# Patient Record
Sex: Female | Born: 1955 | Race: Black or African American | Hispanic: No | Smoking: Current every day smoker
Health system: Southern US, Community
[De-identification: ages and names within clinical notes are randomized; demographics above are authoritative.]

## PROBLEM LIST (undated history)

## (undated) DIAGNOSIS — F329 Major depressive disorder, single episode, unspecified: Secondary | ICD-10-CM

## (undated) DIAGNOSIS — F32A Depression, unspecified: Secondary | ICD-10-CM

## (undated) DIAGNOSIS — F419 Anxiety disorder, unspecified: Secondary | ICD-10-CM

---

## 1996-06-25 HISTORY — PX: ABDOMINAL HYSTERECTOMY: SHX81

## 2000-01-31 ENCOUNTER — Encounter: Admission: RE | Admit: 2000-01-31 | Discharge: 2000-01-31 | Payer: Self-pay | Admitting: Family Medicine

## 2000-01-31 ENCOUNTER — Encounter: Payer: Self-pay | Admitting: Family Medicine

## 2000-02-07 ENCOUNTER — Encounter: Admission: RE | Admit: 2000-02-07 | Discharge: 2000-02-07 | Payer: Self-pay | Admitting: Unknown Physician Specialty

## 2000-02-07 ENCOUNTER — Encounter: Payer: Self-pay | Admitting: Family Medicine

## 2000-11-15 ENCOUNTER — Other Ambulatory Visit: Admission: RE | Admit: 2000-11-15 | Discharge: 2000-11-15 | Payer: Self-pay | Admitting: Family Medicine

## 2004-08-02 ENCOUNTER — Ambulatory Visit: Payer: Self-pay | Admitting: Internal Medicine

## 2004-09-08 ENCOUNTER — Ambulatory Visit: Payer: Self-pay | Admitting: Internal Medicine

## 2004-09-11 ENCOUNTER — Ambulatory Visit: Payer: Self-pay | Admitting: Internal Medicine

## 2004-09-20 ENCOUNTER — Ambulatory Visit (HOSPITAL_COMMUNITY): Admission: RE | Admit: 2004-09-20 | Discharge: 2004-09-20 | Payer: Self-pay

## 2004-10-02 ENCOUNTER — Ambulatory Visit: Payer: Self-pay | Admitting: Internal Medicine

## 2007-02-20 ENCOUNTER — Emergency Department (HOSPITAL_COMMUNITY): Admission: EM | Admit: 2007-02-20 | Discharge: 2007-02-20 | Payer: Self-pay | Admitting: Family Medicine

## 2008-04-15 ENCOUNTER — Ambulatory Visit (HOSPITAL_COMMUNITY): Admission: RE | Admit: 2008-04-15 | Discharge: 2008-04-15 | Payer: Self-pay | Admitting: Family Medicine

## 2008-07-21 ENCOUNTER — Emergency Department (HOSPITAL_COMMUNITY): Admission: EM | Admit: 2008-07-21 | Discharge: 2008-07-21 | Payer: Self-pay | Admitting: Family Medicine

## 2009-01-17 ENCOUNTER — Emergency Department (HOSPITAL_COMMUNITY): Admission: EM | Admit: 2009-01-17 | Discharge: 2009-01-17 | Payer: Self-pay | Admitting: Emergency Medicine

## 2010-06-10 ENCOUNTER — Emergency Department (HOSPITAL_COMMUNITY)
Admission: EM | Admit: 2010-06-10 | Discharge: 2010-06-10 | Payer: Self-pay | Source: Home / Self Care | Admitting: Family Medicine

## 2011-04-11 ENCOUNTER — Other Ambulatory Visit (HOSPITAL_COMMUNITY): Payer: Self-pay | Admitting: Family Medicine

## 2012-07-21 ENCOUNTER — Other Ambulatory Visit (HOSPITAL_COMMUNITY): Payer: Self-pay | Admitting: Family Medicine

## 2012-07-21 DIAGNOSIS — Z1231 Encounter for screening mammogram for malignant neoplasm of breast: Secondary | ICD-10-CM

## 2012-08-15 ENCOUNTER — Other Ambulatory Visit: Payer: Self-pay | Admitting: Obstetrics and Gynecology

## 2012-08-18 ENCOUNTER — Encounter (HOSPITAL_COMMUNITY): Payer: Self-pay | Admitting: *Deleted

## 2012-08-26 ENCOUNTER — Ambulatory Visit (HOSPITAL_COMMUNITY)
Admission: RE | Admit: 2012-08-26 | Discharge: 2012-08-26 | Disposition: A | Discharge status: Home / Self Care | Payer: Self-pay | Source: Ambulatory Visit | Attending: Obstetrics and Gynecology | Admitting: Obstetrics and Gynecology

## 2012-08-26 ENCOUNTER — Encounter (HOSPITAL_COMMUNITY): Payer: Self-pay

## 2012-08-26 VITALS — BP 122/80 | Temp 98.1°F | Ht 67.0 in | Wt 193.6 lb

## 2012-08-26 DIAGNOSIS — Z1239 Encounter for other screening for malignant neoplasm of breast: Secondary | ICD-10-CM

## 2012-08-26 DIAGNOSIS — Z1231 Encounter for screening mammogram for malignant neoplasm of breast: Secondary | ICD-10-CM

## 2012-08-26 NOTE — Patient Instructions (Addendum)
Taught patient how to perform BSE. Patient did not need a Pap smear today due to last Pap smear due to a history of a hysterectomy for benign reasons. Let patient know that she would not need any further Pap smears due to history of a hysterectomy for benign reasons and no history of an abnormal Pap smear.  Let patient know will follow up with her within the next couple weeks with results by letter or phone. Patient verbalized understanding. Patient is escorted to mammography for a screening mammogram.

## 2012-08-26 NOTE — Progress Notes (Signed)
No complaints today.  Pap Smear:    Pap smear not completed today. Last Pap smear was 07/19/2008 and normal. Per patient no history of abnormal Pap smears. Patient has a history of a hysterectomy in 1998 due to fiborids. Per ACOG and BCCCP guidelines patient does not need any further Pap smears.  Pap smear result above is in EPIC.  Physical exam: Breasts Breasts symmetrical. No skin abnormalities bilateral breasts. Small skin lesion observed under left breast.  No nipple retraction bilateral breasts. No nipple discharge bilateral breasts. No lymphadenopathy. No lumps palpated bilateral breasts. No complaints of pain or tenderness on exam. Patient escorted to mammography for a screening mammogram.        Pelvic/Bimanual No Pap smear completed today since patient has a history of a hysterectomy for benign reasons. Pap smear not indicated per BCCCP guidelines.

## 2012-10-23 ENCOUNTER — Emergency Department (HOSPITAL_COMMUNITY)
Admission: EM | Admit: 2012-10-23 | Discharge: 2012-10-23 | Disposition: A | Discharge status: Home / Self Care | Payer: Self-pay | Attending: Emergency Medicine | Admitting: Emergency Medicine

## 2012-10-23 ENCOUNTER — Encounter (HOSPITAL_COMMUNITY): Payer: Self-pay

## 2012-10-23 DIAGNOSIS — F17200 Nicotine dependence, unspecified, uncomplicated: Secondary | ICD-10-CM | POA: Insufficient documentation

## 2012-10-23 DIAGNOSIS — R5381 Other malaise: Secondary | ICD-10-CM | POA: Insufficient documentation

## 2012-10-23 DIAGNOSIS — R42 Dizziness and giddiness: Secondary | ICD-10-CM | POA: Insufficient documentation

## 2012-10-23 DIAGNOSIS — Z79899 Other long term (current) drug therapy: Secondary | ICD-10-CM | POA: Insufficient documentation

## 2012-10-23 DIAGNOSIS — R110 Nausea: Secondary | ICD-10-CM | POA: Insufficient documentation

## 2012-10-23 DIAGNOSIS — R197 Diarrhea, unspecified: Secondary | ICD-10-CM | POA: Insufficient documentation

## 2012-10-23 DIAGNOSIS — R5383 Other fatigue: Secondary | ICD-10-CM | POA: Insufficient documentation

## 2012-10-23 MED ORDER — MECLIZINE HCL 25 MG PO TABS
50.0000 mg | ORAL_TABLET | Freq: Once | ORAL | Status: AC
  Administered 2012-10-23: 50 mg via ORAL
  Filled 2012-10-23: qty 2

## 2012-10-23 MED ORDER — MECLIZINE HCL 50 MG PO TABS: 25.0000 mg | ORAL_TABLET | Freq: Three times a day (TID) | ORAL | Status: DC | PRN

## 2012-10-23 NOTE — ED Notes (Signed)
Pt presents with dizziness and nausea that started this morning. States she had a recent cold. Denies any weakness.

## 2012-10-23 NOTE — ED Notes (Signed)
Dr. Noe Gens at the bedside with [place], [place]

## 2012-10-23 NOTE — ED Notes (Signed)
Shari, PA at the bedside.  

## 2012-10-23 NOTE — ED Provider Notes (Signed)
History     CSN: [id number]  Arrival date & time 10/23/12  4098   First MD Initiated Contact with Patient 10/23/12 [phone]      Chief Complaint  Patient presents with  . Dizziness    (Consider location/radiation/quality/duration/timing/severity/associated sxs/prior treatment) Patient is a 57 y.o. female presenting with weakness. The history is provided by the patient. No language interpreter was used.  Weakness This is a new problem. The current episode started today. The problem has been unchanged. Associated symptoms include nausea and weakness. Pertinent negatives include no abdominal pain, chest pain, diaphoresis, fever, headaches, myalgias or visual change. Associated symptoms comments: Dizziness since shortly after getting up this morning like the room is spinning. Some diarrhea, mild nausea without vomiting. No fever. She reports having URI symptoms about 2 weeks ago but no other recent illnesses. She feels she is off balance to the right when walking but denies falls or any weakness in extremities..    History reviewed. No pertinent past medical history.  Past Surgical History  Procedure Laterality Date  . Abdominal hysterectomy  1998    Family History  Problem Relation Age of Onset  . Diabetes Father   . Cancer Father     prostate, colon    History  Substance Use Topics  . Smoking status: Current Every Day Smoker -- 0.25 packs/day for 20 years    Types: Cigarettes  . Smokeless tobacco: Never Used  . Alcohol Use: Yes     Comment: occassionally    OB History   Grav Para Term Preterm Abortions TAB SAB Ect Mult Living   4 2 2  2  2   2       Review of Systems  Constitutional: Negative for fever and diaphoresis.  HENT: Negative for hearing loss and tinnitus.   Eyes: Negative for visual disturbance.  Respiratory: Negative for shortness of breath.   Cardiovascular: Negative for chest pain.  Gastrointestinal: Positive for nausea and diarrhea. Negative for abdominal  pain.  Musculoskeletal: Negative for myalgias.  Neurological: Positive for dizziness and weakness. Negative for headaches.  Psychiatric/Behavioral: Negative for confusion.    Allergies  Review of patient's allergies indicates no known allergies.  Home Medications   Current Outpatient Rx  Name  Route  Sig  Dispense  Refill  . citalopram (CELEXA) 10 MG tablet   Oral   Take 10 mg by mouth daily.           BP 164/97  Pulse 92  Temp(Src) 98 F (36.7 C) (Oral)  Resp 14  SpO2 99%  Physical Exam  Constitutional: She is oriented to person, place, and time. She appears well-developed and well-nourished.  HENT:  Head: Normocephalic.  Eyes: Pupils are equal, round, and reactive to light.  Neck: Normal range of motion. Neck supple.  Cardiovascular: Normal rate and regular rhythm.   Pulmonary/Chest: Effort normal and breath sounds normal.  Abdominal: Soft. Bowel sounds are normal. There is no tenderness. There is no rebound and no guarding.  Musculoskeletal: Normal range of motion.  Neurological: She is alert and oriented to person, place, and time. She has normal strength and normal reflexes. No sensory deficit. She displays a negative Romberg sign. Coordination normal.  No pronator drift, normal coordination without deficit. Cranial nerves 3-12 grossly intact. Patient does not cooperate with eye ROM to determine nystagmus.  Skin: Skin is warm and dry. No rash noted.  Psychiatric: She has a normal mood and affect.    ED Course  Procedures (including critical  care time)  Labs Reviewed - No data to display No results found.   No diagnosis found.  1. Vertigo   MDM  Meclizine with improvement. Symptoms and exam support benign positional vertigo.         Arnoldo Hooker, PA-C 10/23/12 1108

## 2012-10-24 NOTE — ED Provider Notes (Signed)
Medical screening examination/treatment/procedure(s) were performed by non-physician practitioner and as supervising physician I was immediately available for consultation/collaboration.    Vida Roller, MD 10/24/12 [phone]

## 2013-02-26 ENCOUNTER — Ambulatory Visit: Payer: Self-pay

## 2013-08-05 ENCOUNTER — Other Ambulatory Visit (HOSPITAL_COMMUNITY): Payer: Self-pay | Admitting: Physician Assistant

## 2013-08-05 DIAGNOSIS — Z1231 Encounter for screening mammogram for malignant neoplasm of breast: Secondary | ICD-10-CM

## 2013-08-31 ENCOUNTER — Ambulatory Visit (HOSPITAL_COMMUNITY): Payer: Self-pay

## 2013-09-11 ENCOUNTER — Ambulatory Visit (HOSPITAL_COMMUNITY): Payer: Self-pay

## 2013-09-28 ENCOUNTER — Ambulatory Visit (HOSPITAL_COMMUNITY)
Admission: RE | Admit: 2013-09-28 | Discharge: 2013-09-28 | Disposition: A | Discharge status: Home / Self Care | Payer: Self-pay | Source: Ambulatory Visit | Attending: Physician Assistant | Admitting: Physician Assistant

## 2013-09-28 DIAGNOSIS — Z1231 Encounter for screening mammogram for malignant neoplasm of breast: Secondary | ICD-10-CM

## 2014-04-26 ENCOUNTER — Encounter (HOSPITAL_COMMUNITY): Payer: Self-pay

## 2014-05-12 ENCOUNTER — Encounter (HOSPITAL_COMMUNITY): Payer: Self-pay

## 2014-05-12 ENCOUNTER — Emergency Department (HOSPITAL_COMMUNITY): Payer: Self-pay

## 2014-05-12 ENCOUNTER — Observation Stay (HOSPITAL_COMMUNITY)
Admission: EM | Admit: 2014-05-12 | Discharge: 2014-05-13 | Disposition: A | Discharge status: Home / Self Care | Payer: Self-pay | Attending: Internal Medicine | Admitting: Internal Medicine

## 2014-05-12 DIAGNOSIS — F17210 Nicotine dependence, cigarettes, uncomplicated: Secondary | ICD-10-CM | POA: Insufficient documentation

## 2014-05-12 DIAGNOSIS — Z720 Tobacco use: Secondary | ICD-10-CM | POA: Diagnosis present

## 2014-05-12 DIAGNOSIS — F329 Major depressive disorder, single episode, unspecified: Secondary | ICD-10-CM | POA: Insufficient documentation

## 2014-05-12 DIAGNOSIS — Z79899 Other long term (current) drug therapy: Secondary | ICD-10-CM | POA: Insufficient documentation

## 2014-05-12 DIAGNOSIS — F32A Depression, unspecified: Secondary | ICD-10-CM | POA: Diagnosis present

## 2014-05-12 DIAGNOSIS — R079 Chest pain, unspecified: Principal | ICD-10-CM | POA: Insufficient documentation

## 2014-05-12 DIAGNOSIS — F419 Anxiety disorder, unspecified: Secondary | ICD-10-CM | POA: Diagnosis present

## 2014-05-12 HISTORY — DX: Anxiety disorder, unspecified: F41.9

## 2014-05-12 HISTORY — DX: Major depressive disorder, single episode, unspecified: F32.9

## 2014-05-12 HISTORY — DX: Depression, unspecified: F32.A

## 2014-05-12 LAB — CBC
HCT: 37.7 % (ref 36.0–46.0)
HEMOGLOBIN: 12.6 g/dL (ref 12.0–15.0)
MCH: 26.5 pg (ref 26.0–34.0)
MCHC: 33.4 g/dL (ref 30.0–36.0)
MCV: 79.2 fL (ref 78.0–100.0)
Platelets: 235 10*3/uL (ref 150–400)
RBC: 4.76 MIL/uL (ref 3.87–5.11)
RDW: 14.3 % (ref 11.5–15.5)
WBC: 6.4 10*3/uL (ref 4.0–10.5)

## 2014-05-12 LAB — BASIC METABOLIC PANEL
Anion gap: 12 (ref 5–15)
BUN: 17 mg/dL (ref 6–23)
CALCIUM: 10 mg/dL (ref 8.4–10.5)
CO2: 27 mEq/L (ref 19–32)
Chloride: 103 mEq/L (ref 96–112)
Creatinine, Ser: 0.78 mg/dL (ref 0.50–1.10)
GLUCOSE: 88 mg/dL (ref 70–99)
POTASSIUM: 4.4 meq/L (ref 3.7–5.3)
Sodium: 142 mEq/L (ref 137–147)

## 2014-05-12 LAB — I-STAT TROPONIN, ED: Troponin i, poc: 0 ng/mL (ref 0.00–0.08)

## 2014-05-12 MED ORDER — ASPIRIN 81 MG PO CHEW
324.0000 mg | CHEWABLE_TABLET | Freq: Once | ORAL | Status: AC
  Administered 2014-05-12: 324 mg via ORAL
  Filled 2014-05-12: qty 4

## 2014-05-12 NOTE — H&P (Signed)
[facility] History and Physical  Lauren DiegoVicki R Therrell ZOX:[id number] DOB: [birth year] DOA: 05/12/2014  Referring physician: ED physician PCP: Default, Provider, MD  Specialists:   Chief Complaint: chest pain  HPI: Lauren Rogers is a 58 y.o. female without a significant past medical history except for smoking, who presents with chest pain.  Patient states pain has been intermittent over the past 3 weeks. She reports that her chest pain appears to be related to exertional activities. her chest pain is located in the substernal area, pressure-like, 5-10 out of 10 in severity, radiating to the back, it is aggravated by picking up kids (patient is taking care of kids at home). Her chest pain is intermittent, happens approximately once a day. Each time, it lasts for about 1 hour her chest pain is not pleuritic. It is not aggravated by deep breath.   Patient does not have fever, chills, shortness of breath, nausea, vomiting, diarrhea, abdominal pain, hematuria, hematochezia, leg edema.   Work up in the ED demonstrates Negative chest x-ray. Initial troponin and EKG is negative.  Review of Systems: As presented in the history of presenting illness, rest negative.  Where does patient live?  At home Can patient participate in ADLs? Yes  Allergy: No Known Allergies  History reviewed. No pertinent past medical history.  Past Surgical History  Procedure Laterality Date  . Abdominal hysterectomy  1998    Social History:  reports that she has been smoking Cigarettes.  She has a 5 pack-year smoking history. She has never used smokeless tobacco. She reports that she drinks alcohol. She reports that she does not use illicit drugs.  Family History:  Family History  Problem Relation Age of Onset  . Diabetes Father   . Cancer Father     prostate, colon     Prior to Admission medications   Medication Sig Start Date End Date Taking? Authorizing Provider  buPROPion (WELLBUTRIN XL) 300 MG 24 hr  tablet Take 300 mg by mouth daily.   Yes Historical Provider, MD  clonazePAM (KLONOPIN) 2 MG tablet Take 2 mg by mouth daily as needed for anxiety.   Yes Historical Provider, MD  hydrOXYzine (ATARAX/VISTARIL) 10 MG tablet Take 10 mg by mouth 3 (three) times daily as needed. 04/23/14  Yes Historical Provider, MD  SERTRALINE HCL PO Take 1 tablet by mouth at bedtime.   Yes Historical Provider, MD  triazolam (HALCION) 0.25 MG tablet Take 0.25 mg by mouth at bedtime as needed (for sleep).   Yes Historical Provider, MD  meclizine (ANTIVERT) 50 MG tablet Take 0.5 tablets (25 mg total) by mouth 3 (three) times daily as needed. Patient not taking: Reported on 05/12/2014 10/23/12   Arnoldo HookerShari A Upstill, PA-C    Physical Exam: Filed Vitals:   05/12/14 1829 05/12/14 2115 05/12/14 2145 05/12/14 2208  BP: 138/93 138/87 147/88   Pulse:  61    Temp: 97.8 F (36.6 C)     TempSrc: Oral     Resp: 18 15 11    Height:    5\' 7"  (1.702 m)  Weight:    84.369 kg (186 lb)  SpO2: 99% 100%     General: Not in acute distress HEENT:       Eyes: PERRL, EOMI, no scleral icterus       ENT: No discharge from the ears and nose, no pharynx injection, no tonsillar enlargement.        Neck: No JVD, no bruit, no mass felt. Cardiac: S1/S2, RRR, No murmurs,  No gallops or rubs Pulm: Good air movement bilaterally. Clear to auscultation bilaterally. No rales, wheezing, rhonchi or rubs. Abd: Soft, nondistended, nontender, no rebound pain, no organomegaly, BS present Ext: No edema bilaterally. 2+DP/PT pulse bilaterally Musculoskeletal: No joint deformities, erythema, or stiffness, ROM full Skin: No rashes.  Neuro: Alert and oriented X3, cranial nerves II-XII grossly intact, muscle strength 5/5 in all extremeties, sensation to light touch intact. Brachial reflex 2+ bilaterally. Knee reflex 1+ bilaterally. Negative Babinski's sign. Normal finger to nose test. Psych: Patient is not psychotic, no suicidal or hemocidal ideation.  Labs on  Admission:  Basic Metabolic Panel:  Recent Labs Lab 05/12/14 1842  NA 142  K 4.4  CL 103  CO2 27  GLUCOSE 88  BUN 17  CREATININE 0.78  CALCIUM 10.0   Liver Function Tests: No results for input(s): AST, ALT, ALKPHOS, BILITOT, PROT, ALBUMIN in the last 168 hours. No results for input(s): LIPASE, AMYLASE in the last 168 hours. No results for input(s): AMMONIA in the last 168 hours. CBC:  Recent Labs Lab 05/12/14 1842  WBC 6.4  HGB 12.6  HCT 37.7  MCV 79.2  PLT 235   Cardiac Enzymes: No results for input(s): CKTOTAL, CKMB, CKMBINDEX, TROPONINI in the last 168 hours.  BNP (last 3 results) No results for input(s): PROBNP in the last 8760 hours. CBG: No results for input(s): GLUCAP in the last 168 hours.  Radiological Exams on Admission: Dg Chest 2 View  05/12/2014   CLINICAL DATA:  Midsternal chest pain for 2 weeks  EXAM: CHEST  2 VIEW  COMPARISON:  None.  FINDINGS: The heart size and mediastinal contours are within normal limits. Both lungs are clear. The visualized skeletal structures are unremarkable.  IMPRESSION: No active cardiopulmonary disease.   Electronically Signed   By: Alcide CleverMark  Lukens M.D.   On: 05/12/2014 19:57    EKG: Independently reviewed.   Assessment/Plan Principal Problem:   Chest pain Active Problems:   Tobacco abuse  Chest pain: her chest pain seems to be exertional. It is important to rule out ACS. Chest x-ray is negative for pneumonia. Pulmonary embolism is less likely given low Well's score.  - will admit to tele bed - cycle CE q6 x3 and repeat her EKG in the am  - Nitroglycerin, Morphine, and aspirin - Risk factor stratification: will check TSH, FLP and A1C  - Consider cardiology consult if test positive for CEs  - 2 D echo - UDS  Tobacco abuse: Patient smokes 2-3 cigarettes per day for more than 20 years. - did counseling   DVT ppx: SQ Heparin    Code Status: Full code Family Communication: Yes, patient's daughter   at bed  side Disposition Plan: Admit to inpatient   Date of Service 05/12/2014    Lorretta HarpIU, Brooklen Runquist [facility] Pager [phone]  If 7PM-7AM, please contact night-coverage [email or web address] Password [email or web address] 05/12/2014, 10:55 PM

## 2014-05-12 NOTE — ED Notes (Signed)
Pt from home with chest pain that has been present since this am.  Pt went to urgent care and was told to follow up at ER if pain persisted.  Pt localizes pain to central chest with radiation to back.  Pain 10/10. Reports nausea and feeling hot; denies diaphoresis, weakness, vomiting or shortness of breath.

## 2014-05-12 NOTE — ED Provider Notes (Signed)
CSN: [id number]     Arrival date & time 05/12/14  1820 History   First MD Initiated Contact with Patient 05/12/14 2120     Chief Complaint  Patient presents with  . Chest Pain     (Consider location/radiation/quality/duration/timing/severity/associated sxs/prior Treatment) Patient is a 58 y.o. female presenting with chest pain. The history is provided by the patient and medical records.  Chest Pain Associated symptoms: diaphoresis and shortness of breath     This is a 58 year old female with no significant past medical history presenting to the ED for chest pain. Patient states pain has been intermittent over the past 3 weeks. States pain appears to be related to exertional activities.  States when pain occurs it is localized to her mid-sternal region with radiation to her back.  States duration varies, can last as long as 1 hour before resolving.  She notes associated SOB, diaphoresis, nausea when pain occurs.  Patient denies prior cardiac hx.  No known family cardiac hx.  She is a daily smoker, approx 3-4 cigs/day.  Patient states she was having pain on arrival, but resolved while in the waiting room. PCP-- Holley Boucheandall Harris [facility]  History reviewed. No pertinent past medical history. Past Surgical History  Procedure Laterality Date  . Abdominal hysterectomy  1998   Family History  Problem Relation Age of Onset  . Diabetes Father   . Cancer Father     prostate, colon   History  Substance Use Topics  . Smoking status: Current Every Day Smoker -- 0.25 packs/day for 20 years    Types: Cigarettes  . Smokeless tobacco: Never Used  . Alcohol Use: Yes     Comment: occassionally   OB History    Gravida Para Term Preterm AB TAB SAB Ectopic Multiple Living   4 2 2  2  2   2      Review of Systems  Constitutional: Positive for diaphoresis.  Respiratory: Positive for shortness of breath.   Cardiovascular: Positive for chest pain.  All other systems reviewed and are  negative.     Allergies  Review of patient's allergies indicates no known allergies.  Home Medications   Prior to Admission medications   Medication Sig Start Date End Date Taking? Authorizing Provider  meclizine (ANTIVERT) 50 MG tablet Take 0.5 tablets (25 mg total) by mouth 3 (three) times daily as needed. 10/23/12   Shari A Upstill, PA-C  triazolam (HALCION) 0.25 MG tablet Take 0.25 mg by mouth at bedtime as needed (for sleep).    Historical Provider, MD   BP 138/93 mmHg  Temp(Src) 97.8 F (36.6 C) (Oral)  Resp 18  SpO2 99%   Physical Exam  Constitutional: She is oriented to person, place, and time. She appears well-developed and well-nourished. No distress.  HENT:  Head: Normocephalic and atraumatic.  Mouth/Throat: Oropharynx is clear and moist.  Eyes: Conjunctivae and EOM are normal. Pupils are equal, round, and reactive to light.  Neck: Normal range of motion. Neck supple.  Cardiovascular: Normal rate, regular rhythm and normal heart sounds.   Pulmonary/Chest: Effort normal and breath sounds normal. No respiratory distress. She has no wheezes.  No chest wall tenderness or deformities noted  Abdominal: Soft. Bowel sounds are normal. There is no tenderness. There is no guarding.  Musculoskeletal: Normal range of motion.  Neurological: She is alert and oriented to person, place, and time.  Skin: Skin is warm and dry. She is not diaphoretic.  Psychiatric: She has a normal mood and affect.  Nursing note and vitals reviewed.   ED Course  Procedures (including critical care time) Labs Review Labs Reviewed  CBC  BASIC METABOLIC PANEL  I-STAT TROPOININ, ED    Imaging Review Dg Chest 2 View  05/12/2014   CLINICAL DATA:  Midsternal chest pain for 2 weeks  EXAM: CHEST  2 VIEW  COMPARISON:  None.  FINDINGS: The heart size and mediastinal contours are within normal limits. Both lungs are clear. The visualized skeletal structures are unremarkable.  IMPRESSION: No active  cardiopulmonary disease.   Electronically Signed   By: Alcide CleverMark  Lukens M.D.   On: 05/12/2014 19:57     EKG Interpretation   Date/Time:  Wednesday May 12 2014 18:24:10 EST Ventricular Rate:  71 PR Interval:  146 QRS Duration: 64 QT Interval:  376 QTC Calculation: 408 R Axis:   67 Text Interpretation:  Normal sinus rhythm Normal ECG Confirmed by  Rubin PayorPICKERING  MD, Harrold DonathNATHAN [phone] on 05/12/2014 9:30:35 PM      MDM   Final diagnoses:  Chest pain   58 y.o. F with intermittent chest pain over the past 3 weeks, appears to be related with exertion.  Notes associated SOB and diaphoresis with pain. Work-up initiated in triage-- EKG sinus rhythm without ischemic changes.  Trop negative.  Lab work reassuring.  CXR clear.  Patient currently pain free.  Given her intermittent exertional chest pain and associated symptoms as well as HEART score of 5, feel she would benefit from cardiac rule out.  Case discussed with hospitalist, Dr. Clyde LundborgNiu, who will admit for telemetry observation.  Temp admission orders placed, VS remain stable.  Garlon HatchetLisa M Emillia Weatherly, PA-C 05/12/14 2314  Juliet RudeNathan R. Rubin PayorPickering, MD 05/12/14 [phone]

## 2014-05-13 ENCOUNTER — Encounter (HOSPITAL_COMMUNITY): Payer: Self-pay | Admitting: General Practice

## 2014-05-13 DIAGNOSIS — Z720 Tobacco use: Secondary | ICD-10-CM

## 2014-05-13 DIAGNOSIS — F329 Major depressive disorder, single episode, unspecified: Secondary | ICD-10-CM

## 2014-05-13 DIAGNOSIS — F419 Anxiety disorder, unspecified: Secondary | ICD-10-CM

## 2014-05-13 DIAGNOSIS — R0789 Other chest pain: Secondary | ICD-10-CM

## 2014-05-13 LAB — LIPID PANEL
Cholesterol: 166 mg/dL (ref 0–200)
HDL: 58 mg/dL (ref >39)
LDL CALC: 89 mg/dL (ref 0–99)
Total CHOL/HDL Ratio: 2.9 RATIO
Triglycerides: 93 mg/dL (ref <150)
VLDL: 19 mg/dL (ref 0–40)

## 2014-05-13 LAB — HEMOGLOBIN A1C
Hgb A1c MFr Bld: 5.9 % — ABNORMAL HIGH (ref <5.7)
Mean Plasma Glucose: 123 mg/dL — ABNORMAL HIGH (ref <117)

## 2014-05-13 LAB — COMPREHENSIVE METABOLIC PANEL
ALBUMIN: 3.9 g/dL (ref 3.5–5.2)
ALK PHOS: 90 U/L (ref 39–117)
ALT: 11 U/L (ref 0–35)
ANION GAP: 14 (ref 5–15)
AST: 15 U/L (ref 0–37)
BUN: 15 mg/dL (ref 6–23)
CO2: 26 meq/L (ref 19–32)
CREATININE: 0.79 mg/dL (ref 0.50–1.10)
Calcium: 9.3 mg/dL (ref 8.4–10.5)
Chloride: 104 mEq/L (ref 96–112)
GFR calc Af Amer: >90 mL/min (ref >90)
Glucose, Bld: 122 mg/dL — ABNORMAL HIGH (ref 70–99)
POTASSIUM: 4 meq/L (ref 3.7–5.3)
Sodium: 144 mEq/L (ref 137–147)
Total Bilirubin: 0.3 mg/dL (ref 0.3–1.2)
Total Protein: 7.4 g/dL (ref 6.0–8.3)

## 2014-05-13 LAB — CBC
HEMATOCRIT: 39.1 % (ref 36.0–46.0)
Hemoglobin: 12.8 g/dL (ref 12.0–15.0)
MCH: 26.5 pg (ref 26.0–34.0)
MCHC: 32.7 g/dL (ref 30.0–36.0)
MCV: 81 fL (ref 78.0–100.0)
Platelets: 249 10*3/uL (ref 150–400)
RBC: 4.83 MIL/uL (ref 3.87–5.11)
RDW: 14.3 % (ref 11.5–15.5)
WBC: 4.7 10*3/uL (ref 4.0–10.5)

## 2014-05-13 LAB — PROTIME-INR
INR: 1.08 (ref 0.00–1.49)
Prothrombin Time: 14.1 seconds (ref 11.6–15.2)

## 2014-05-13 LAB — TROPONIN I

## 2014-05-13 LAB — TSH: TSH: 3.05 u[IU]/mL (ref 0.350–4.500)

## 2014-05-13 MED ORDER — ASPIRIN 81 MG PO CHEW
81.0000 mg | CHEWABLE_TABLET | Freq: Every day | ORAL | Status: DC
  Administered 2014-05-13: 81 mg via ORAL
  Filled 2014-05-13: qty 1

## 2014-05-13 MED ORDER — MECLIZINE HCL 25 MG PO TABS
25.0000 mg | ORAL_TABLET | Freq: Three times a day (TID) | ORAL | Status: DC | PRN
  Filled 2014-05-13: qty 1

## 2014-05-13 MED ORDER — MORPHINE SULFATE 2 MG/ML IJ SOLN: 2.0000 mg | INTRAMUSCULAR | Status: DC | PRN

## 2014-05-13 MED ORDER — OMEPRAZOLE 40 MG PO CPDR: 40.0000 mg | DELAYED_RELEASE_CAPSULE | Freq: Every day | ORAL | Status: AC

## 2014-05-13 MED ORDER — ACETAMINOPHEN 325 MG PO TABS: 650.0000 mg | ORAL_TABLET | Freq: Four times a day (QID) | ORAL | Status: DC | PRN

## 2014-05-13 MED ORDER — PANTOPRAZOLE SODIUM 40 MG PO TBEC
40.0000 mg | DELAYED_RELEASE_TABLET | Freq: Every day | ORAL | Status: DC
  Administered 2014-05-13: 40 mg via ORAL
  Filled 2014-05-13: qty 1

## 2014-05-13 MED ORDER — HYDROXYZINE HCL 25 MG PO TABS
25.0000 mg | ORAL_TABLET | Freq: Three times a day (TID) | ORAL | Status: DC | PRN
  Administered 2014-05-13: 25 mg via ORAL
  Filled 2014-05-13 (×2): qty 1

## 2014-05-13 MED ORDER — TRIAZOLAM 0.25 MG PO TABS
0.2500 mg | ORAL_TABLET | Freq: Every evening | ORAL | Status: DC | PRN
  Administered 2014-05-13: 0.25 mg via ORAL
  Filled 2014-05-13: qty 2
  Filled 2014-05-13: qty 1

## 2014-05-13 MED ORDER — FAMOTIDINE 20 MG PO TABS: 20.0000 mg | ORAL_TABLET | Freq: Two times a day (BID) | ORAL | Status: DC | PRN

## 2014-05-13 MED ORDER — NITROGLYCERIN 0.4 MG SL SUBL: 0.4000 mg | SUBLINGUAL_TABLET | SUBLINGUAL | Status: DC | PRN

## 2014-05-13 MED ORDER — SODIUM CHLORIDE 0.9 % IJ SOLN
3.0000 mL | Freq: Two times a day (BID) | INTRAMUSCULAR | Status: DC
  Administered 2014-05-13 (×2): 3 mL via INTRAVENOUS

## 2014-05-13 MED ORDER — HEPARIN SODIUM (PORCINE) 5000 UNIT/ML IJ SOLN
5000.0000 [IU] | Freq: Three times a day (TID) | INTRAMUSCULAR | Status: DC
  Filled 2014-05-13 (×3): qty 1

## 2014-05-13 MED ORDER — CLONAZEPAM 0.5 MG PO TABS: 2.0000 mg | ORAL_TABLET | Freq: Every day | ORAL | Status: DC | PRN

## 2014-05-13 MED ORDER — BUPROPION HCL ER (XL) 300 MG PO TB24
300.0000 mg | ORAL_TABLET | Freq: Every day | ORAL | Status: DC
  Administered 2014-05-13: 300 mg via ORAL
  Filled 2014-05-13: qty 1

## 2014-05-13 MED ORDER — ACETAMINOPHEN 650 MG RE SUPP: 650.0000 mg | Freq: Four times a day (QID) | RECTAL | Status: DC | PRN

## 2014-05-13 MED ORDER — FAMOTIDINE 20 MG PO TABS
20.0000 mg | ORAL_TABLET | Freq: Every day | ORAL | Status: DC
  Administered 2014-05-13: 20 mg via ORAL
  Filled 2014-05-13: qty 1

## 2014-05-13 MED ORDER — SERTRALINE HCL 25 MG PO TABS
25.0000 mg | ORAL_TABLET | Freq: Every day | ORAL | Status: DC
Start: 2014-05-13 — End: 2014-05-13
  Administered 2014-05-13: 25 mg via ORAL
  Filled 2014-05-13 (×2): qty 1

## 2014-05-13 NOTE — Plan of Care (Signed)
Problem: Phase I Progression Outcomes Goal: Aspirin unless contraindicated Outcome: Completed/Met Date Met:  05/13/14

## 2014-05-13 NOTE — Progress Notes (Signed)
UR completed 

## 2014-05-13 NOTE — Discharge Summary (Signed)
Physician Discharge Summary  Lauren DiegoVicki R Rogers XBJ:[id number] DOB: June 21, 1956 DOA: 05/12/2014  PCP: Johny BlamerHARRIS, WILLIAM, MD  Admit date: 05/12/2014 Discharge date: 05/13/2014  Time spent: 45 minutes  Recommendations for Outpatient Follow-up:  1. Follow-up on chest pain - decide whether the patient needs an outpatient GI eval  Discharge Condition: Stable Diet recommendation: Heart healthy  Discharge Diagnoses:  Principal Problem:   Chest pain-most likely related to gastroesophageal reflux disease/esophagitis Active Problems:   Tobacco abuse   Depression   Anxiety   History of present illness/ Hospital course: Is a 58 year old female with a past medical history of smoking and anxiety who presents with a complaint of chest pain which has been intermittent over the past 3 weeks. She states that it is pressure-like in the center of her chest and sometimes goes into her back. She tells me that it usually occurs after eating and sometimes occurs with certain movements. It appears that yesterday she stated that it is related to exertional activities however today she tells me that it is not related to exertion. The patient was admitted overnight to rule out acute coronary syndrome. Troponins have remained negative- chest pain resolved on its own. EKG on admission revealed sinus rhythm with no significant abnormalities.  The chest pain recurred this morning after she had breakfast currently feels like she has a dull ache in the center of her chest. At this point there is no radiation of pain. She states that sometimes it feels like the food is stuck in her chest or has trouble going down into her stomach however, she does not have any exacerbation of pain upon swallowing. She does not complain of any "heartburn". At times she states the pain occurs when she feels anxious.   Her symptoms are highly suggestive of gastroesophageal reflux disease/esophagitis and also possibly acomponent of panic/anxiety. I  have discussed giving her a trial of proton pump inhibitors with PRN H2 blockers. He has tried Tums but they do not completely relieve her pain. She has no insurance and therefore we have decided that she will take over-the-counter Prilosec daily and Pepcid as needed. I have asked her to make an appointment with her PCP 2 weeks from now and further discuss her symptoms and management. If symptoms have not resolved, she may need a referral for an endoscopy.  Discharge Exam: Filed Weights   05/12/14 2208 05/13/14 0021  Weight: 84.369 kg (186 lb) 83.9 kg (184 lb 15.5 oz)   Filed Vitals:   05/13/14 0458  BP: 108/69  Pulse: 72  Temp: 97.6 F (36.4 C)  Resp: 18    General: AAO x 3, no distress Cardiovascular: RRR, no murmurs  Respiratory: clear to auscultation bilaterally GI: soft, non-tender, non-distended, bowel sound positive  Discharge Instructions You were cared for by a hospitalist during your hospital stay. If you have any questions about your discharge medications or the care you received while you were in the hospital after you are discharged, you can call the unit and asked to speak with the hospitalist on call if the hospitalist that took care of you is not available. Once you are discharged, your primary care physician will handle any further medical issues. Please note that NO REFILLS for any discharge medications will be authorized once you are discharged, as it is imperative that you return to your primary care physician (or establish a relationship with a primary care physician if you do not have one) for your aftercare needs so that they can reassess your  need for medications and monitor your lab values.      Discharge Instructions    Diet - low sodium heart healthy    Complete by:  As directed      Discharge instructions    Complete by:  As directed   Do not take ibuprofen or naproxen as it can worsen the chest pain.     Increase activity slowly    Complete by:  As  directed             Medication List    TAKE these medications        buPROPion 300 MG 24 hr tablet  Commonly known as:  WELLBUTRIN XL  Take 300 mg by mouth daily.     clonazePAM 2 MG tablet  Commonly known as:  KLONOPIN  Take 2 mg by mouth daily as needed for anxiety.     famotidine 20 MG tablet  Commonly known as:  PEPCID  Take 1 tablet (20 mg total) by mouth 2 (two) times daily as needed for heartburn or indigestion.     hydrOXYzine 10 MG tablet  Commonly known as:  ATARAX/VISTARIL  Take 10 mg by mouth 3 (three) times daily as needed.     meclizine 50 MG tablet  Commonly known as:  ANTIVERT  Take 0.5 tablets (25 mg total) by mouth 3 (three) times daily as needed.     omeprazole 40 MG capsule  Commonly known as:  PRILOSEC  Take 1 capsule (40 mg total) by mouth daily.     SERTRALINE HCL PO  Take 1 tablet by mouth at bedtime.     triazolam 0.25 MG tablet  Commonly known as:  HALCION  Take 0.25 mg by mouth at bedtime as needed (for sleep).       No Known Allergies Follow-up Information    Follow up with Johny BlamerHARRIS, WILLIAM, MD In 2 weeks.   Specialty:  Family Medicine   Contact information:   [address] [place] [phone]        The results of significant diagnostics from this hospitalization (including imaging, microbiology, ancillary and laboratory) are listed below for reference.    Significant Diagnostic Studies: Dg Chest 2 View  05/12/2014   CLINICAL DATA:  Midsternal chest pain for 2 weeks  EXAM: CHEST  2 VIEW  COMPARISON:  None.  FINDINGS: The heart size and mediastinal contours are within normal limits. Both lungs are clear. The visualized skeletal structures are unremarkable.  IMPRESSION: No active cardiopulmonary disease.   Electronically Signed   By: Alcide CleverMark  Lukens M.D.   On: 05/12/2014 19:57    Microbiology: No results found for this or any previous visit (from the past 240 hour(s)).   Labs: Basic Metabolic  Panel:  Recent Labs Lab 05/12/14 1842 05/13/14 0850  NA 142 144  K 4.4 4.0  CL 103 104  CO2 27 26  GLUCOSE 88 122*  BUN 17 15  CREATININE 0.78 0.79  CALCIUM 10.0 9.3   Liver Function Tests:  Recent Labs Lab 05/13/14 0850  AST 15  ALT 11  ALKPHOS 90  BILITOT 0.3  PROT 7.4  ALBUMIN 3.9   No results for input(s): LIPASE, AMYLASE in the last 168 hours. No results for input(s): AMMONIA in the last 168 hours. CBC:  Recent Labs Lab 05/12/14 1842 05/13/14 0850  WBC 6.4 4.7  HGB 12.6 12.8  HCT 37.7 39.1  MCV 79.2 81.0  PLT 235 249   Cardiac  Enzymes:  Recent Labs Lab 05/13/14 0148 05/13/14 0850  TROPONINI <0.30 <0.30   BNP: BNP (last 3 results) No results for input(s): PROBNP in the last 8760 hours. CBG: No results for input(s): GLUCAP in the last 168 hours.     SignedCalvert Cantor, MD [facility] 05/13/2014, 11:48 AM

## 2014-05-13 NOTE — Plan of Care (Signed)
Problem: Phase I Progression Outcomes Goal: Other Phase I Outcomes/Goals Outcome: Completed/Met Date Met:  05/13/14     

## 2014-05-13 NOTE — Plan of Care (Signed)
Problem: Phase I Progression Outcomes Goal: Anginal pain relieved Outcome: Completed/Met Date Met:  05/13/14     

## 2014-05-13 NOTE — Plan of Care (Signed)
Problem: Phase I Progression Outcomes Goal: Voiding-avoid urinary catheter unless indicated Outcome: Completed/Met Date Met:  05/13/14     

## 2014-05-13 NOTE — Plan of Care (Signed)
Problem: Phase II Progression Outcomes Goal: Hemodynamically stable Outcome: Progressing [facility] of chest pain radiating to back.  She arrived via stretcher, in no apparent distress.  Alert and oriented x4, ambulatory with steady gait.  Placed on telemetry and oriented to room and floor procedures.  No complaints of chest pain since arrival.  Will continue to monitor patient's condition.

## 2014-05-13 NOTE — Plan of Care (Signed)
Problem: Phase I Progression Outcomes Goal: MD aware of Cardiac Marker results Outcome: Completed/Met Date Met:  05/13/14

## 2014-07-31 ENCOUNTER — Emergency Department (INDEPENDENT_AMBULATORY_CARE_PROVIDER_SITE_OTHER)
Admission: EM | Admit: 2014-07-31 | Discharge: 2014-07-31 | Disposition: A | Discharge status: Home / Self Care | Payer: Self-pay | Source: Home / Self Care | Attending: Emergency Medicine | Admitting: Emergency Medicine

## 2014-07-31 ENCOUNTER — Encounter (HOSPITAL_COMMUNITY): Payer: Self-pay | Admitting: *Deleted

## 2014-07-31 DIAGNOSIS — M545 Low back pain, unspecified: Secondary | ICD-10-CM

## 2014-07-31 MED ORDER — MELOXICAM 15 MG PO TABS: 15.0000 mg | ORAL_TABLET | Freq: Every day | ORAL | Status: DC

## 2014-07-31 MED ORDER — PREDNISONE 20 MG PO TABS: 40.0000 mg | ORAL_TABLET | Freq: Every day | ORAL | Status: DC

## 2014-07-31 MED ORDER — HYDROCODONE-ACETAMINOPHEN 5-325 MG PO TABS: 1.0000 | ORAL_TABLET | Freq: Four times a day (QID) | ORAL | Status: DC | PRN

## 2014-07-31 NOTE — Discharge Instructions (Signed)
You have inflammation in your SI joint. Take prednisone 40 mg daily for 5 days. Take meloxicam 15 mg daily for the next week, then as needed. Not take ibuprofen or Aleve with this medicine. Use the Norco at night to help you rest. 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 You should see improvement in the next 2-3 days, but it will be 1-2 weeks before everything heals. Follow-up as needed.

## 2014-07-31 NOTE — ED Provider Notes (Signed)
CSN: [id number]     Arrival date & time 07/31/14  1750 History   First MD Initiated Contact with Patient 07/31/14 1819     Chief Complaint  Patient presents with  . Back Pain   (Consider location/radiation/quality/duration/timing/severity/associated sxs/prior Treatment) HPI  She is a 59 year old woman here for evaluation of left low back pain. This started yesterday and has gradually been getting worse. It is located in her left lower back. It does not radiate down her leg. No bowel or bladder incontinence. The pain is worse if she stands on her left leg or if she lays flat. She has no history of back pain. 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  Past Medical History  Diagnosis Date  . Depression   . Anxiety    Past Surgical History  Procedure Laterality Date  . Abdominal hysterectomy  1998   Family History  Problem Relation Age of Onset  . Diabetes Father   . Cancer Father     prostate, colon   History  Substance Use Topics  . Smoking status: Current Every Day Smoker -- 0.25 packs/day for 20 years    Types: Cigarettes  . Smokeless tobacco: Never Used  . Alcohol Use: Yes     Comment: occassionally   OB History    Gravida Para Term Preterm AB TAB SAB Ectopic Multiple Living   4 2 2  2  2   2      Review of Systems As in history of present illness Allergies  Review of patient's allergies indicates no known allergies.  Home Medications   Prior to Admission medications   Medication Sig Start Date End Date Taking? Authorizing Provider  buPROPion (WELLBUTRIN XL) 300 MG 24 hr tablet Take 300 mg by mouth daily.    Historical Provider, MD  clonazePAM (KLONOPIN) 2 MG tablet Take 2 mg by mouth daily as needed for anxiety.    Historical Provider, MD  famotidine (PEPCID) 20 MG tablet Take 1 tablet (20 mg total) by mouth 2 (two) times daily as needed for heartburn or indigestion. 05/13/14   Calvert CantorSaima Rizwan, MD   HYDROcodone-acetaminophen (NORCO) 5-325 MG per tablet Take 1 tablet by mouth every 6 (six) hours as needed for moderate pain. 07/31/14   Charm RingsErin J Komal Stangelo, MD  hydrOXYzine (ATARAX/VISTARIL) 10 MG tablet Take 10 mg by mouth 3 (three) times daily as needed. 04/23/14   Historical Provider, MD  meclizine (ANTIVERT) 50 MG tablet Take 0.5 tablets (25 mg total) by mouth 3 (three) times daily as needed. Patient not taking: Reported on 05/12/2014 10/23/12   Melvenia BeamShari A Upstill, PA-C  meloxicam (MOBIC) 15 MG tablet Take 1 tablet (15 mg total) by mouth daily. 07/31/14   Charm RingsErin J Bryse Blanchette, MD  omeprazole (PRILOSEC) 40 MG capsule Take 1 capsule (40 mg total) by mouth daily. 05/13/14   Calvert CantorSaima Rizwan, MD  predniSONE (DELTASONE) 20 MG tablet Take 2 tablets (40 mg total) by mouth daily. 07/31/14   Charm RingsErin J Zira Helinski, MD  SERTRALINE HCL PO Take 1 tablet by mouth at bedtime.    Historical Provider, MD  triazolam (HALCION) 0.25 MG tablet Take 0.25 mg by mouth at bedtime as needed (for sleep).    Historical Provider, MD   BP 147/95 mmHg  Pulse 79  Temp(Src) 98 F (36.7 C) (Oral)  Resp 16  SpO2 97% Physical Exam  Constitutional: She is oriented to person, place, and time. She appears  well-developed and well-nourished. She appears distressed.  Looks uncomfortable.  Cardiovascular: Normal rate.   Pulmonary/Chest: Effort normal.  Musculoskeletal:  Back: No vertebral tenderness or step-offs. She is tender over the left SI joint. Negative straight leg raise bilaterally. Faber positive on the left. 5 out of 5 strength in hip flexion bilaterally.  Neurological: She is alert and oriented to person, place, and time.    ED Course  Procedures (including critical care time) Labs Review Labs Reviewed - No data to display  Imaging Review No results found.   MDM   1. Left-sided low back pain without sciatica    Likely due to SI joint inflammation. We'll treat with 5 days of prednisone and daily meloxicam. Prescription for Norco provided  to use at night for severe pain. Follow-up in 1-2 weeks if no improvement.    Charm Rings, MD 07/31/14 [phone]

## 2014-07-31 NOTE — ED Notes (Signed)
Pt   Reports    Symptoms  Of  Back  Pain   Worse  On  Movement  With  Radiation    Down  The  l  Leg       Pt  denys  Any  Known  specefic  Injury     Pt  denys    Any        Urinary  Symptoms

## 2015-02-20 ENCOUNTER — Emergency Department (HOSPITAL_COMMUNITY): Payer: Self-pay | Admitting: Certified Registered Nurse Anesthetist

## 2015-02-20 ENCOUNTER — Encounter (HOSPITAL_COMMUNITY)
Admission: EM | Disposition: A | Discharge status: Home / Self Care | Payer: Self-pay | Source: Home / Self Care | Attending: Emergency Medicine

## 2015-02-20 ENCOUNTER — Emergency Department (INDEPENDENT_AMBULATORY_CARE_PROVIDER_SITE_OTHER)
Admission: EM | Admit: 2015-02-20 | Discharge: 2015-02-20 | Disposition: A | Discharge status: Home / Self Care | Payer: Self-pay | Source: Home / Self Care | Attending: Family Medicine | Admitting: Family Medicine

## 2015-02-20 ENCOUNTER — Emergency Department (HOSPITAL_COMMUNITY): Payer: Self-pay

## 2015-02-20 ENCOUNTER — Ambulatory Visit (HOSPITAL_COMMUNITY)
Admission: EM | Admit: 2015-02-20 | Discharge: 2015-02-22 | Disposition: A | Discharge status: Home / Self Care | Payer: Self-pay | Attending: Surgery | Admitting: Surgery

## 2015-02-20 ENCOUNTER — Encounter (HOSPITAL_COMMUNITY): Payer: Self-pay | Admitting: Emergency Medicine

## 2015-02-20 ENCOUNTER — Encounter (HOSPITAL_COMMUNITY): Payer: Self-pay | Admitting: *Deleted

## 2015-02-20 DIAGNOSIS — F17210 Nicotine dependence, cigarettes, uncomplicated: Secondary | ICD-10-CM | POA: Insufficient documentation

## 2015-02-20 DIAGNOSIS — R1011 Right upper quadrant pain: Secondary | ICD-10-CM

## 2015-02-20 DIAGNOSIS — R1010 Upper abdominal pain, unspecified: Secondary | ICD-10-CM

## 2015-02-20 DIAGNOSIS — F329 Major depressive disorder, single episode, unspecified: Secondary | ICD-10-CM | POA: Insufficient documentation

## 2015-02-20 DIAGNOSIS — R1013 Epigastric pain: Secondary | ICD-10-CM | POA: Insufficient documentation

## 2015-02-20 DIAGNOSIS — F419 Anxiety disorder, unspecified: Secondary | ICD-10-CM | POA: Insufficient documentation

## 2015-02-20 DIAGNOSIS — K3580 Unspecified acute appendicitis: Secondary | ICD-10-CM | POA: Diagnosis present

## 2015-02-20 HISTORY — PX: LAPAROSCOPIC APPENDECTOMY: SHX408

## 2015-02-20 LAB — CBC WITH DIFFERENTIAL/PLATELET
BASOS ABS: 0 10*3/uL (ref 0.0–0.1)
BASOS PCT: 0 % (ref 0–1)
Eosinophils Absolute: 0 10*3/uL (ref 0.0–0.7)
Eosinophils Relative: 0 % (ref 0–5)
HEMATOCRIT: 40.8 % (ref 36.0–46.0)
Hemoglobin: 13.6 g/dL (ref 12.0–15.0)
Lymphocytes Relative: 18 % (ref 12–46)
Lymphs Abs: 2.4 10*3/uL (ref 0.7–4.0)
MCH: 26.8 pg (ref 26.0–34.0)
MCHC: 33.3 g/dL (ref 30.0–36.0)
MCV: 80.3 fL (ref 78.0–100.0)
MONO ABS: 1 10*3/uL (ref 0.1–1.0)
Monocytes Relative: 7 % (ref 3–12)
NEUTROS PCT: 75 % (ref 43–77)
Neutro Abs: 10.2 10*3/uL — ABNORMAL HIGH (ref 1.7–7.7)
PLATELETS: 235 10*3/uL (ref 150–400)
RBC: 5.08 MIL/uL (ref 3.87–5.11)
RDW: 14.4 % (ref 11.5–15.5)
WBC: 13.6 10*3/uL — ABNORMAL HIGH (ref 4.0–10.5)

## 2015-02-20 LAB — COMPREHENSIVE METABOLIC PANEL
ALBUMIN: 3.9 g/dL (ref 3.5–5.0)
ALT: 15 U/L (ref 14–54)
AST: 23 U/L (ref 15–41)
Alkaline Phosphatase: 91 U/L (ref 38–126)
Anion gap: 6 (ref 5–15)
BILIRUBIN TOTAL: 0.6 mg/dL (ref 0.3–1.2)
BUN: 10 mg/dL (ref 6–20)
CHLORIDE: 105 mmol/L (ref 101–111)
CO2: 28 mmol/L (ref 22–32)
CREATININE: 0.72 mg/dL (ref 0.44–1.00)
Calcium: 9.5 mg/dL (ref 8.9–10.3)
GFR calc Af Amer: >60 mL/min (ref >60)
GLUCOSE: 114 mg/dL — AB (ref 65–99)
Potassium: 4.2 mmol/L (ref 3.5–5.1)
Sodium: 139 mmol/L (ref 135–145)
Total Protein: 7.8 g/dL (ref 6.5–8.1)

## 2015-02-20 LAB — URINALYSIS, ROUTINE W REFLEX MICROSCOPIC
Bilirubin Urine: NEGATIVE
GLUCOSE, UA: NEGATIVE mg/dL
Ketones, ur: NEGATIVE mg/dL
Leukocytes, UA: NEGATIVE
Nitrite: NEGATIVE
PH: 6 (ref 5.0–8.0)
Protein, ur: 30 mg/dL — AB
Specific Gravity, Urine: 1.021 (ref 1.005–1.030)
Urobilinogen, UA: 0.2 mg/dL (ref 0.0–1.0)

## 2015-02-20 LAB — LIPASE, BLOOD: Lipase: 15 U/L — ABNORMAL LOW (ref 22–51)

## 2015-02-20 LAB — URINE MICROSCOPIC-ADD ON

## 2015-02-20 SURGERY — APPENDECTOMY, LAPAROSCOPIC
Anesthesia: General | Site: Abdomen

## 2015-02-20 MED ORDER — ONDANSETRON HCL 4 MG/2ML IJ SOLN: 4.0000 mg | Freq: Once | INTRAMUSCULAR | Status: DC | PRN

## 2015-02-20 MED ORDER — KETOROLAC TROMETHAMINE 30 MG/ML IJ SOLN
INTRAMUSCULAR | Status: AC
  Administered 2015-02-20: 30 mg via INTRAVENOUS
  Filled 2015-02-20: qty 1

## 2015-02-20 MED ORDER — HYDROMORPHONE HCL 1 MG/ML IJ SOLN
1.0000 mg | Freq: Once | INTRAMUSCULAR | Status: AC
  Administered 2015-02-20: 1 mg via INTRAVENOUS
  Filled 2015-02-20: qty 1

## 2015-02-20 MED ORDER — OXYCODONE HCL 5 MG PO TABS: 5.0000 mg | ORAL_TABLET | Freq: Once | ORAL | Status: DC | PRN

## 2015-02-20 MED ORDER — BUPIVACAINE-EPINEPHRINE (PF) 0.25% -1:200000 IJ SOLN
INTRAMUSCULAR | Status: AC
  Filled 2015-02-20: qty 30

## 2015-02-20 MED ORDER — FENTANYL CITRATE (PF) 100 MCG/2ML IJ SOLN
25.0000 ug | INTRAMUSCULAR | Status: DC | PRN
  Administered 2015-02-20: 25 ug via INTRAVENOUS

## 2015-02-20 MED ORDER — FENTANYL CITRATE (PF) 100 MCG/2ML IJ SOLN
INTRAMUSCULAR | Status: DC | PRN
  Administered 2015-02-20 (×6): 50 ug via INTRAVENOUS

## 2015-02-20 MED ORDER — BUPIVACAINE LIPOSOME 1.3 % IJ SUSP
20.0000 mL | Freq: Once | INTRAMUSCULAR | Status: DC
  Filled 2015-02-20: qty 20

## 2015-02-20 MED ORDER — DEXTROSE 5 % IV SOLN
2.0000 g | INTRAVENOUS | Status: AC
  Administered 2015-02-21: 2 g via INTRAVENOUS
  Filled 2015-02-20 (×2): qty 2

## 2015-02-20 MED ORDER — ONDANSETRON HCL 4 MG/2ML IJ SOLN
4.0000 mg | Freq: Once | INTRAMUSCULAR | Status: AC
  Administered 2015-02-20: 4 mg via INTRAVENOUS
  Filled 2015-02-20: qty 2

## 2015-02-20 MED ORDER — SODIUM CHLORIDE 0.9 % IV BOLUS (SEPSIS)
1000.0000 mL | Freq: Once | INTRAVENOUS | Status: AC
  Administered 2015-02-20: 1000 mL via INTRAVENOUS

## 2015-02-20 MED ORDER — ONDANSETRON HCL 4 MG/2ML IJ SOLN
INTRAMUSCULAR | Status: AC
  Filled 2015-02-20: qty 2

## 2015-02-20 MED ORDER — DEXTROSE 5 % IV SOLN
2.0000 g | INTRAVENOUS | Status: DC | PRN
  Administered 2015-02-20: 2 g via INTRAVENOUS

## 2015-02-20 MED ORDER — KCL IN DEXTROSE-NACL 20-5-0.45 MEQ/L-%-% IV SOLN
INTRAVENOUS | Status: DC
  Administered 2015-02-21 (×2): via INTRAVENOUS
  Filled 2015-02-20 (×3): qty 1000

## 2015-02-20 MED ORDER — ARTIFICIAL TEARS OP OINT
TOPICAL_OINTMENT | OPHTHALMIC | Status: AC
  Filled 2015-02-20: qty 7

## 2015-02-20 MED ORDER — IOHEXOL 300 MG/ML  SOLN
25.0000 mL | Freq: Once | INTRAMUSCULAR | Status: AC | PRN
  Administered 2015-02-20: 25 mL via ORAL

## 2015-02-20 MED ORDER — ROCURONIUM BROMIDE 100 MG/10ML IV SOLN
INTRAVENOUS | Status: DC | PRN
  Administered 2015-02-20 (×2): 10 mg via INTRAVENOUS
  Administered 2015-02-20: 30 mg via INTRAVENOUS

## 2015-02-20 MED ORDER — MIDAZOLAM HCL 2 MG/2ML IJ SOLN
INTRAMUSCULAR | Status: AC
  Filled 2015-02-20: qty 4

## 2015-02-20 MED ORDER — NEOSTIGMINE METHYLSULFATE 10 MG/10ML IV SOLN
INTRAVENOUS | Status: AC
  Filled 2015-02-20: qty 2

## 2015-02-20 MED ORDER — FENTANYL CITRATE (PF) 250 MCG/5ML IJ SOLN
INTRAMUSCULAR | Status: AC
  Filled 2015-02-20: qty 5

## 2015-02-20 MED ORDER — GLYCOPYRROLATE 0.2 MG/ML IJ SOLN
INTRAMUSCULAR | Status: DC | PRN
  Administered 2015-02-20: 0.6 mg via INTRAVENOUS

## 2015-02-20 MED ORDER — METRONIDAZOLE IN NACL 5-0.79 MG/ML-% IV SOLN
500.0000 mg | Freq: Three times a day (TID) | INTRAVENOUS | Status: DC
  Administered 2015-02-20: 500 mg via INTRAVENOUS
  Filled 2015-02-20 (×3): qty 100

## 2015-02-20 MED ORDER — IOHEXOL 300 MG/ML  SOLN
100.0000 mL | Freq: Once | INTRAMUSCULAR | Status: AC | PRN
  Administered 2015-02-20: 100 mL via INTRAVENOUS

## 2015-02-20 MED ORDER — NEOSTIGMINE METHYLSULFATE 10 MG/10ML IV SOLN
INTRAVENOUS | Status: DC | PRN
  Administered 2015-02-20: 5 mg via INTRAVENOUS

## 2015-02-20 MED ORDER — SUCCINYLCHOLINE CHLORIDE 20 MG/ML IJ SOLN
INTRAMUSCULAR | Status: DC | PRN
  Administered 2015-02-20: 110 mg via INTRAVENOUS

## 2015-02-20 MED ORDER — LIDOCAINE HCL (CARDIAC) 20 MG/ML IV SOLN
INTRAVENOUS | Status: AC
  Filled 2015-02-20: qty 5

## 2015-02-20 MED ORDER — MIDAZOLAM HCL 5 MG/5ML IJ SOLN
INTRAMUSCULAR | Status: DC | PRN
  Administered 2015-02-20: 2 mg via INTRAVENOUS

## 2015-02-20 MED ORDER — LIDOCAINE HCL (PF) 1 % IJ SOLN
INTRAMUSCULAR | Status: AC
  Filled 2015-02-20: qty 30

## 2015-02-20 MED ORDER — SODIUM CHLORIDE 0.9 % IR SOLN
Status: DC | PRN
  Administered 2015-02-20: 1

## 2015-02-20 MED ORDER — BUPIVACAINE LIPOSOME 1.3 % IJ SUSP
INTRAMUSCULAR | Status: DC | PRN
  Administered 2015-02-20: 20 mL

## 2015-02-20 MED ORDER — GLYCOPYRROLATE 0.2 MG/ML IJ SOLN
INTRAMUSCULAR | Status: AC
  Filled 2015-02-20: qty 6

## 2015-02-20 MED ORDER — LIDOCAINE HCL (CARDIAC) 20 MG/ML IV SOLN
INTRAVENOUS | Status: DC | PRN
Start: 2015-02-20 — End: 2015-02-20
  Administered 2015-02-20: 50 mg via INTRAVENOUS

## 2015-02-20 MED ORDER — LACTATED RINGERS IV SOLN
INTRAVENOUS | Status: DC | PRN
  Administered 2015-02-20 (×2): via INTRAVENOUS

## 2015-02-20 MED ORDER — KETOROLAC TROMETHAMINE 30 MG/ML IJ SOLN
30.0000 mg | Freq: Four times a day (QID) | INTRAMUSCULAR | Status: DC | PRN
  Administered 2015-02-20: 30 mg via INTRAVENOUS
  Filled 2015-02-20: qty 1

## 2015-02-20 MED ORDER — FENTANYL CITRATE (PF) 100 MCG/2ML IJ SOLN
INTRAMUSCULAR | Status: AC
  Filled 2015-02-20: qty 2

## 2015-02-20 MED ORDER — ONDANSETRON HCL 4 MG/2ML IJ SOLN
INTRAMUSCULAR | Status: DC | PRN
  Administered 2015-02-20: 4 mg via INTRAVENOUS

## 2015-02-20 MED ORDER — PROPOFOL 10 MG/ML IV BOLUS
INTRAVENOUS | Status: DC | PRN
  Administered 2015-02-20: 180 mg via INTRAVENOUS

## 2015-02-20 MED ORDER — OXYCODONE HCL 5 MG/5ML PO SOLN: 5.0000 mg | Freq: Once | ORAL | Status: DC | PRN

## 2015-02-20 SURGICAL SUPPLY — 48 items
ADH SKN CLS LQ APL DERMABOND (GAUZE/BANDAGES/DRESSINGS) ×1
APPLIER CLIP ROT 10 11.4 M/L (STAPLE)
APR CLP MED LRG 11.4X10 (STAPLE)
BAG SPEC RTRVL LRG 6X4 10 (ENDOMECHANICALS) ×1
BLADE SURG ROTATE 9660 (MISCELLANEOUS) ×2 IMPLANT
CANISTER SUCTION 2500CC (MISCELLANEOUS) ×3 IMPLANT
CATH FOLEY 2WAY SLVR  5CC 12FR (CATHETERS) ×2
CATH FOLEY 2WAY SLVR 5CC 12FR (CATHETERS) IMPLANT
CHLORAPREP W/TINT 26ML (MISCELLANEOUS) ×3 IMPLANT
CLIP APPLIE ROT 10 11.4 M/L (STAPLE) IMPLANT
COVER SURGICAL LIGHT HANDLE (MISCELLANEOUS) ×3 IMPLANT
CUTTER FLEX LINEAR 45M (STAPLE) ×3 IMPLANT
DERMABOND ADHESIVE PROPEN (GAUZE/BANDAGES/DRESSINGS) ×2
DERMABOND ADVANCED .7 DNX6 (GAUZE/BANDAGES/DRESSINGS) IMPLANT
DRAPE WARM FLUID 44X44 (DRAPE) ×1 IMPLANT
ELECT REM PT RETURN 9FT ADLT (ELECTROSURGICAL) ×3
ELECTRODE REM PT RTRN 9FT ADLT (ELECTROSURGICAL) ×1 IMPLANT
ENDOLOOP SUT PDS II  0 18 (SUTURE)
ENDOLOOP SUT PDS II 0 18 (SUTURE) IMPLANT
GLOVE BIO SURGEON STRL SZ 6 (GLOVE) ×3 IMPLANT
GLOVE BIOGEL PI IND STRL 6.5 (GLOVE) ×1 IMPLANT
GLOVE BIOGEL PI IND STRL 7.0 (GLOVE) IMPLANT
GLOVE BIOGEL PI INDICATOR 6.5 (GLOVE) ×2
GLOVE BIOGEL PI INDICATOR 7.0 (GLOVE) ×2
GOWN STRL REUS W/ TWL LRG LVL3 (GOWN DISPOSABLE) ×2 IMPLANT
GOWN STRL REUS W/TWL 2XL LVL3 (GOWN DISPOSABLE) ×5 IMPLANT
GOWN STRL REUS W/TWL LRG LVL3 (GOWN DISPOSABLE) ×6
KIT BASIN OR (CUSTOM PROCEDURE TRAY) ×3 IMPLANT
KIT ROOM TURNOVER OR (KITS) ×3 IMPLANT
LIQUID BAND (GAUZE/BANDAGES/DRESSINGS) ×3 IMPLANT
NS IRRIG 1000ML POUR BTL (IV SOLUTION) ×3 IMPLANT
PAD ARMBOARD 7.5X6 YLW CONV (MISCELLANEOUS) ×6 IMPLANT
POUCH SPECIMEN RETRIEVAL 10MM (ENDOMECHANICALS) ×3 IMPLANT
RELOAD STAPLE 45 3.5 BLU ETS (ENDOMECHANICALS) ×1 IMPLANT
RELOAD STAPLE TA45 3.5 REG BLU (ENDOMECHANICALS) ×3 IMPLANT
SCALPEL HARMONIC ACE (MISCELLANEOUS) ×3 IMPLANT
SET IRRIG TUBING LAPAROSCOPIC (IRRIGATION / IRRIGATOR) ×3 IMPLANT
SLEEVE ENDOPATH XCEL 5M (ENDOMECHANICALS) ×5 IMPLANT
SPECIMEN JAR SMALL (MISCELLANEOUS) ×3 IMPLANT
SUT MNCRL AB 4-0 PS2 18 (SUTURE) ×3 IMPLANT
TOWEL OR 17X24 6PK STRL BLUE (TOWEL DISPOSABLE) ×3 IMPLANT
TOWEL OR 17X26 10 PK STRL BLUE (TOWEL DISPOSABLE) ×3 IMPLANT
TRAY FOLEY CATH 16FR SILVER (SET/KITS/TRAYS/PACK) ×3 IMPLANT
TRAY FOLEY CATH 16FRSI W/METER (SET/KITS/TRAYS/PACK) ×2 IMPLANT
TRAY LAPAROSCOPIC MC (CUSTOM PROCEDURE TRAY) ×3 IMPLANT
TROCAR XCEL BLUNT TIP 100MML (ENDOMECHANICALS) ×3 IMPLANT
TROCAR XCEL NON-BLD 5MMX100MML (ENDOMECHANICALS) ×3 IMPLANT
TUBING INSUFFLATION (TUBING) ×3 IMPLANT

## 2015-02-20 NOTE — ED Notes (Addendum)
Abdominal pain started around 4:00 am , woke patient.  Patient has had nausea, vomiting.  No diarrhea.  Patient had normal bm this am.  Points to epigastric area as location ofpain, refers to pain as "pressure"

## 2015-02-20 NOTE — ED Notes (Signed)
MD at bedside. 

## 2015-02-20 NOTE — ED Provider Notes (Signed)
CSN: [id number]     Arrival date & time 02/20/15  1506 History   First MD Initiated Contact with Patient 02/20/15 1656     Chief Complaint  Patient presents with  . Abdominal Pain    The history is provided by the patient. No language interpreter was used.   Ms. Kalya Troeger for evaluation of abdominal pain. Her pain began at 4 AM this morning and is described as pressure type sensation and is constant in nature. It is across her upper abdomen. She has associated nausea and vomiting once. She denies any diarrhea, constipation, dysuria, fevers. She has had some chills since the pain began. No history of similar prior previous symptoms.  Symptoms are moderate, constant, worsening. She was seen at urgent care and referred to the emergency department for further evaluation.  Past Medical History  Diagnosis Date  . Depression   . Anxiety    Past Surgical History  Procedure Laterality Date  . Abdominal hysterectomy  1998   Family History  Problem Relation Age of Onset  . Diabetes Father   . Cancer Father     prostate, colon   Social History  Substance Use Topics  . Smoking status: Current Every Day Smoker -- 0.25 packs/day for 20 years    Types: Cigarettes  . Smokeless tobacco: Never Used  . Alcohol Use: Yes     Comment: occassionally   OB History    Gravida Para Term Preterm AB TAB SAB Ectopic Multiple Living   4 2 2  2  2   2      Review of Systems  All other systems reviewed and are negative.     Allergies  Review of patient's allergies indicates no known allergies.  Home Medications   Prior to Admission medications   Medication Sig Start Date End Date Taking? Authorizing Provider  Bismuth Subsalicylate (PEPTO-BISMOL PO) Take by mouth.    Historical Provider, MD  buPROPion (WELLBUTRIN XL) 300 MG 24 hr tablet Take 300 mg by mouth daily.    Historical Provider, MD  clonazePAM (KLONOPIN) 2 MG tablet Take 2 mg by mouth daily as needed for anxiety.    Historical  Provider, MD  famotidine (PEPCID) 20 MG tablet Take 1 tablet (20 mg total) by mouth 2 (two) times daily as needed for heartburn or indigestion. Patient not taking: Reported on 02/20/2015 05/13/14   Calvert Cantor, MD  HYDROcodone-acetaminophen (NORCO) 5-325 MG per tablet Take 1 tablet by mouth every 6 (six) hours as needed for moderate pain. Patient not taking: Reported on 02/20/2015 07/31/14   Charm Rings, MD  hydrOXYzine (ATARAX/VISTARIL) 10 MG tablet Take 10 mg by mouth 3 (three) times daily as needed. 04/23/14   Historical Provider, MD  meclizine (ANTIVERT) 50 MG tablet Take 0.5 tablets (25 mg total) by mouth 3 (three) times daily as needed. Patient not taking: Reported on 05/12/2014 10/23/12   Elpidio Anis, PA-C  meloxicam (MOBIC) 15 MG tablet Take 1 tablet (15 mg total) by mouth daily. Patient not taking: Reported on 02/20/2015 07/31/14   Charm Rings, MD  omeprazole (PRILOSEC) 40 MG capsule Take 1 capsule (40 mg total) by mouth daily. Patient not taking: Reported on 02/20/2015 05/13/14   Calvert Cantor, MD  predniSONE (DELTASONE) 20 MG tablet Take 2 tablets (40 mg total) by mouth daily. Patient not taking: Reported on 02/20/2015 07/31/14   Charm Rings, MD  SERTRALINE HCL PO Take 1 tablet by mouth at bedtime.    Historical Provider,  MD  triazolam (HALCION) 0.25 MG tablet Take 0.25 mg by mouth at bedtime as needed (for sleep).    Historical Provider, MD   BP 145/86 mmHg  Pulse 90  Temp(Src) 98.2 F (36.8 C) (Oral)  Resp 18  SpO2 95% Physical Exam  Constitutional: She is oriented to person, place, and time. She appears well-developed and well-nourished.  HENT:  Head: Normocephalic and atraumatic.  Cardiovascular: Normal rate and regular rhythm.   No murmur heard. Pulmonary/Chest: Effort normal and breath sounds normal. No respiratory distress.  Abdominal: Soft. There is no rebound and no guarding.   Mild diffuse abdominal tenderness with focal  Moderate tenderness in the right lower quadrant   Musculoskeletal: She exhibits no edema or tenderness.  Neurological: She is alert and oriented to person, place, and time.  Skin: Skin is warm and dry.  Psychiatric: She has a normal mood and affect. Her behavior is normal.  Nursing note and vitals reviewed.   ED Course  Procedures (including critical care time) Labs Review Labs Reviewed  CBC WITH DIFFERENTIAL/PLATELET - Abnormal; Notable for the following:    WBC 13.6 (*)    Neutro Abs 10.2 (*)    All other components within normal limits  COMPREHENSIVE METABOLIC PANEL - Abnormal; Notable for the following:    Glucose, Bld 114 (*)    All other components within normal limits  LIPASE, BLOOD - Abnormal; Notable for the following:    Lipase 15 (*)    All other components within normal limits  URINALYSIS, ROUTINE W REFLEX MICROSCOPIC (NOT AT [employer]) - Abnormal; Notable for the following:    APPearance CLOUDY (*)    Hgb urine dipstick TRACE (*)    Protein, ur 30 (*)    All other components within normal limits  URINE MICROSCOPIC-ADD ON  SURGICAL PATHOLOGY    Imaging Review Ct Abdomen Pelvis W Contrast  02/20/2015   CLINICAL DATA:  Mid abdominal pain with epigastric pressure, nausea and vomiting since this morning. Previous hysterectomy. Initial encounter.  EXAM: CT ABDOMEN AND PELVIS WITH CONTRAST  TECHNIQUE: Multidetector CT imaging of the abdomen and pelvis was performed using the standard protocol following bolus administration of intravenous contrast.  CONTRAST:  OMNIPAQUE IOHEXOL 300 MG/ML  SOLN  COMPARISON:  None.  FINDINGS: Lower chest:  Mild atelectasis at the lung bases.  Hepatobiliary: 1.6 cm low-density lesion posteriorly in the right hepatic lobe on image 16 demonstrates peripheral enhancement and is not well visualized on the delayed post-contrast images. This probably represents a hemangioma. No other hepatic abnormalities identified. No evidence of gallstones, gallbladder wall thickening or biliary dilatation.   Pancreas: Unremarkable. No pancreatic ductal dilatation or surrounding inflammatory changes.  Spleen: Normal in size without focal abnormality.  Adrenals/Urinary Tract: Both adrenal glands appear normal. The kidneys appear normal without evidence of urinary tract calculus, suspicious lesion or hydronephrosis. No bladder abnormalities are seen.  Stomach/Bowel: The appendix is markedly distended to 1.7 cm and demonstrates wall thickening and surrounding inflammatory change. There is some free fluid within the pelvis, and appendiceal rupture suspected. No drainable abscess identified. There may be a small spilled appendicolith. There is no evidence of bowel obstruction or wall thickening of the small bowel or colon.  Vascular/Lymphatic: There are no enlarged abdominal or pelvic lymph nodes. No significant vascular findings are present.  Reproductive: Status post hysterectomy. No adnexal mass. There are multiple pelvic phleboliths. As noted above, there is asymmetric free pelvic fluid on the right.  Other: No evidence of abdominal  wall mass or hernia.  Musculoskeletal: No acute or significant osseous findings. Discogenic sclerosis noted in the lower thoracic spine. Probable left iliac bone bone island and left femoral neck synovial herniation pits noted incidentally.  IMPRESSION: 1. Acute appendicitis with surrounding inflammatory changes suspicious for early perforation. No drainable abscess or bowel obstruction. 2. 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 3. These results were called by telephone at the time of interpretation on 02/20/2015 at 7:58 pm to Dr. Tilden Fossa , who verbally acknowledged these results.   Electronically Signed   By: Carey Bullocks M.D.   On: 02/20/2015 19:58   I have personally reviewed and evaluated these images and lab results as part of my medical decision-making.   EKG Interpretation None      MDM   Final diagnoses:  Acute appendicitis,  unspecified acute appendicitis type    Patient here for evaluation of abdominal pain, examination with right lower quadrant tenderness. CT scan is consistent with acute appendicitis. Discussed with patient's findings of CT need for admission for further management. Discussed with Dr. Donell Beers with general surgery, she agrees to see the patient.    Tilden Fossa, MD 02/21/15 [phone]

## 2015-02-20 NOTE — ED Notes (Signed)
OR called and they are ready for the patient, going up with the pt to Short stay at this time.

## 2015-02-20 NOTE — Anesthesia Preprocedure Evaluation (Signed)
Anesthesia Evaluation  Patient identified by MRN, date of birth, ID band Patient awake    Reviewed: Allergy & Precautions, NPO status , Patient's Chart, lab work & pertinent test results  Airway Mallampati: II  TM Distance: >3 FB Neck ROM: Full    Dental  (+) Teeth Intact, Dental Advisory Given   Pulmonary Current Smoker,  breath sounds clear to auscultation        Cardiovascular Rhythm:Regular Rate:Normal     Neuro/Psych    GI/Hepatic   Endo/Other    Renal/GU      Musculoskeletal   Abdominal   Peds  Hematology   Anesthesia Other Findings   Reproductive/Obstetrics                             Anesthesia Physical Anesthesia Plan  ASA: II and emergent  Anesthesia Plan: General   Post-op Pain Management:    Induction: Intravenous  Airway Management Planned: Oral ETT  Additional Equipment:   Intra-op Plan:   Post-operative Plan: Extubation in OR  Informed Consent: I have reviewed the patients History and Physical, chart, labs and discussed the procedure including the risks, benefits and alternatives for the proposed anesthesia with the patient or authorized representative who has indicated his/her understanding and acceptance.   Dental advisory given  Plan Discussed with: CRNA and Anesthesiologist  Anesthesia Plan Comments: (Acute appendicitis GERD smoker  Plan GA with oral ETT and RSI  Kipp Brood)        Anesthesia Quick Evaluation

## 2015-02-20 NOTE — Op Note (Signed)
Appendectomy, Lap, Procedure Note  Indications: The patient presented with a history of right-sided abdominal pain. A CT revealed findings consistent with acute appendicitis.  Pre-operative Diagnosis: acute appendicitis  Post-operative Diagnosis: Same  Surgeon: Almond Lint   Assistants: n/a  Anesthesia: General endotracheal anesthesia and Local anesthesia exparel  ASA Class: 2, emergent  Procedure Details  The patient was seen again in the Holding Room. The risks, benefits, complications, treatment options, and expected outcomes were discussed with the patient and/or family. The possibilities of perforation of viscus, bleeding, recurrent infection, the need for additional procedures, failure to diagnose a condition, and creating a complication requiring transfusion or operation were discussed. There was concurrence with the proposed plan and informed consent was obtained. The site of surgery was properly noted. The patient was taken to Operating Room, identified as Lauren Rogers and the procedure verified as Appendectomy. A Time Out was held and the above information confirmed.  The patient was placed in the supine position and general anesthesia was induced, along with placement of orogastric tube, Venodyne boots, and a Foley catheter. The abdomen was prepped and draped in a sterile fashion. Local anesthetic was infiltrated in the infraumbilical region.  A 1.5 cm curvilinear transverse incision was made just below the umbilicus.  The Kelly clamp was used to spread the subcutaneous tissues.  The fascia was elevated with 2 Kocher clamps and incised with the #11 blade.  A Tresa Endo was used to confirm entrance into the peritoneal cavity.  A pursestring suture was placed around the fascial incision.  The Hasson trocar was inserted into the abdomen and held in place with the tails of the suture.  The pneumoperitoneum was then established to steady pressure of 15 mmHg.     Immediately, significant  adhesions were seen.  A 5 mm trocar was placed in the right upper quadrant.  Some of the adhesions were able to be taken down sharply with this trocar.  Another 5 mm trocar was placed in the RLQ.  Most of the other adhesions were able to be taken down after that.  The small bowel and sigmoid adhesions were fillmy and taken down sharply.  The omental adhesions were taken down with the harmonic scalpel.  Then, a 5 mm trocar was placed in the LLQ.  A suprapubic trocar was not placed.  A careful evaluation of the entire abdomen was carried out. The patient was placed in Trendelenburg and rotated to the left.   The patient was found to have an dilated, necrotic appendix that was extending into the pelvis behind the terminal ileum.  There was subtle perforation.    The appendix was carefully dissected. The appendix was was skeletonized with the harmonic scalpel.   The appendix was divided at its base using an endo-GIA stapler. Minimal appendiceal stump was left in place. The appendix was removed from the abdomen with an Endocatch bag through the left subcostal port.  There was no evidence of bleeding, leakage, or complication after division of the appendix. Irrigation was also performed and irrigate suctioned from the abdomen as well.  The 5 mm trocars were removed.  The pneumoperitoneum was evacuated from the abdomen.    The trocar site skin wounds were closed with 4-0 Monocryl and dressed with Dermabond.  Instrument, sponge, and needle counts were correct at the conclusion of the case.   Findings: The appendix was found to be inflamed. There were signs of necrosis.  There was perforation. There was not abscess formation.  There were  significant adhesions to the abdominal wall of the distal small bowel, omentum, and sigmoid colon.    Estimated Blood Loss:  Minimal    Specimens: appendix to pathology         Complications:  None; patient tolerated the procedure well.         Disposition: PACU -  hemodynamically stable.         Condition: stable

## 2015-02-20 NOTE — ED Provider Notes (Signed)
CSN: [id number]     Arrival date & time 02/20/15  1306 History   First MD Initiated Contact with Patient 02/20/15 1430     Chief Complaint  Patient presents with  . Abdominal Pain   (Consider location/radiation/quality/duration/timing/severity/associated sxs/prior Treatment) Patient is a 59 y.o. female presenting with abdominal pain. The history is provided by the patient.  Abdominal Pain Pain location:  RUQ Pain quality: cramping   Pain radiates to:  Does not radiate Pain severity:  Moderate Onset quality:  Sudden Duration:  10 hours Progression:  Unchanged Chronicity:  New Context: awakening from sleep   Context comment:  Ate fried chicken for dinner last eve, felt fine prior to bed. Relieved by:  Nothing Worsened by:  Nothing tried Ineffective treatments:  None tried Associated symptoms: nausea and vomiting   Associated symptoms: no anorexia, no chest pain, no constipation, no diarrhea and no fever     Past Medical History  Diagnosis Date  . Depression   . Anxiety    Past Surgical History  Procedure Laterality Date  . Abdominal hysterectomy  1998   Family History  Problem Relation Age of Onset  . Diabetes Father   . Cancer Father     prostate, colon   Social History  Substance Use Topics  . Smoking status: Current Every Day Smoker -- 0.25 packs/day for 20 years    Types: Cigarettes  . Smokeless tobacco: Never Used  . Alcohol Use: Yes     Comment: occassionally   OB History    Gravida Para Term Preterm AB TAB SAB Ectopic Multiple Living   Review of Systems  Constitutional: Positive for appetite change. Negative for fever.  Respiratory: Negative.   Cardiovascular: Negative.  Negative for chest pain.  Gastrointestinal: Positive for nausea, vomiting and abdominal pain. Negative for diarrhea, constipation and anorexia.  Genitourinary: Negative.     Allergies  Review of patient's allergies indicates no known allergies.  Home  Medications   Prior to Admission medications   Medication Sig Start Date End Date Taking? Authorizing Provider  Bismuth Subsalicylate (PEPTO-BISMOL PO) Take by mouth.   Yes Historical Provider, MD  buPROPion (WELLBUTRIN XL) 300 MG 24 hr tablet Take 300 mg by mouth daily.    Historical Provider, MD  clonazePAM (KLONOPIN) 2 MG tablet Take 2 mg by mouth daily as needed for anxiety.    Historical Provider, MD  famotidine (PEPCID) 20 MG tablet Take 1 tablet (20 mg total) by mouth 2 (two) times daily as needed for heartburn or indigestion. Patient not taking: Reported on 02/20/2015 05/13/14   Calvert Cantor, MD  HYDROcodone-acetaminophen (NORCO) 5-325 MG per tablet Take 1 tablet by mouth every 6 (six) hours as needed for moderate pain. Patient not taking: Reported on 02/20/2015 07/31/14   Charm Rings, MD  hydrOXYzine (ATARAX/VISTARIL) 10 MG tablet Take 10 mg by mouth 3 (three) times daily as needed. 04/23/14   Historical Provider, MD  meclizine (ANTIVERT) 50 MG tablet Take 0.5 tablets (25 mg total) by mouth 3 (three) times daily as needed. Patient not taking: Reported on 05/12/2014 10/23/12   Elpidio Anis, PA-C  meloxicam (MOBIC) 15 MG tablet Take 1 tablet (15 mg total) by mouth daily. Patient not taking: Reported on 02/20/2015 07/31/14   Charm Rings, MD  omeprazole (PRILOSEC) 40 MG capsule Take 1 capsule (40 mg total) by mouth daily. Patient not taking: Reported on 02/20/2015 05/13/14  Calvert Cantor, MD  predniSONE (DELTASONE) 20 MG tablet Take 2 tablets (40 mg total) by mouth daily. Patient not taking: Reported on 02/20/2015 07/31/14   Charm Rings, MD  SERTRALINE HCL PO Take 1 tablet by mouth at bedtime.    Historical Provider, MD  triazolam (HALCION) 0.25 MG tablet Take 0.25 mg by mouth at bedtime as needed (for sleep).    Historical Provider, MD   Meds Ordered and Administered this Visit  Medications - No data to display  BP 157/92 mmHg  Pulse 88  Temp(Src) 97.9 F (36.6 C) (Oral)  Resp 18  SpO2  97% No data found.   Physical Exam  Constitutional: She is oriented to person, place, and time. She appears well-developed and well-nourished. She appears distressed.  Neck: Normal range of motion. Neck supple.  Abdominal: Soft. Bowel sounds are normal. She exhibits no distension and no mass. There is tenderness in the right upper quadrant. There is positive Murphy's sign. There is no rigidity, no rebound, no guarding, no CVA tenderness and no tenderness at McBurney's point.  Neurological: She is alert and oriented to person, place, and time.  Skin: Skin is warm and dry.  Nursing note and vitals reviewed.   ED Course  Procedures (including critical care time)  Labs Review Labs Reviewed - No data to display  Imaging Review No results found.   Visual Acuity Review  Right Eye Distance:   Left Eye Distance:   Bilateral Distance:    Right Eye Near:   Left Eye Near:    Bilateral Near:         MDM   1. Abdominal discomfort in right upper quadrant    Sent for eval of new onset of acute ruq pain, n/v x1. No fever, no uti sx., likely chole.    Linna Hoff, MD 02/20/15 [phone]

## 2015-02-20 NOTE — H&P (Signed)
Lauren Rogers is an 59 y.o. female.   Chief Complaint: acute appendicitis HPI:  Pt presented to the ED with 12-18 hours of severe abdominal pain.  It started in the upper abdomen, then migrated to the RLQ.  She has not had fevers, but has had chills.  She has had nausea and vomiting.  The pain has gotten worse over the day.  Pain is worse with movement, better at rest. It is also worse with lying flat.     Past Medical History  Diagnosis Date  . Depression   . Anxiety     Past Surgical History  Procedure Laterality Date  . Abdominal hysterectomy  1998    Family History  Problem Relation Age of Onset  . Diabetes Father   . Cancer Father     prostate, colon   Social History:  reports that she has been smoking Cigarettes.  She has a 5 pack-year smoking history. She has never used smokeless tobacco. She reports that she drinks alcohol. She reports that she does not use illicit drugs.  Allergies: No Known Allergies  Medication Bismuth Subsalicylate (PEPTO-BISMOL PO)    buPROPion (WELLBUTRIN XL) 300 MG 24 hr tablet   clonazePAM (KLONOPIN) 2 MG tablet   famotidine (PEPCID) 20 MG tablet   HYDROcodone-acetaminophen (NORCO) 5-325 MG per tablet   hydrOXYzine (ATARAX/VISTARIL) 10 MG tablet   meclizine (ANTIVERT) 50 MG tablet   meloxicam (MOBIC) 15 MG tablet   omeprazole (PRILOSEC) 40 MG capsule   predniSONE (DELTASONE) 20 MG tablet   SERTRALINE HCL PO   triazolam (HALCION) 0.25 MG tablet         Results for orders placed or performed during the hospital encounter of 02/20/15 (from the past 48 hour(s))  CBC WITH DIFFERENTIAL     Status: Abnormal   Collection Time: 02/20/15  6:17 PM  Result Value Ref Range   WBC 13.6 (H) 4.0 - 10.5 K/uL   RBC 5.08 3.87 - 5.11 MIL/uL   Hemoglobin 13.6 12.0 - 15.0 g/dL   HCT 40.8 36.0 - 46.0 %   MCV 80.3 78.0 - 100.0 fL   MCH 26.8 26.0 - 34.0 pg   MCHC 33.3 30.0 - 36.0 g/dL   RDW 14.4 11.5 - 15.5 %   Platelets 235 150 - 400 K/uL   Neutrophils Relative % 75 43 - 77 %   Neutro Abs 10.2 (H) 1.7 - 7.7 K/uL   Lymphocytes Relative 18 12 - 46 %   Lymphs Abs 2.4 0.7 - 4.0 K/uL   Monocytes Relative 7 3 - 12 %   Monocytes Absolute 1.0 0.1 - 1.0 K/uL   Eosinophils Relative 0 0 - 5 %   Eosinophils Absolute 0.0 0.0 - 0.7 K/uL   Basophils Relative 0 0 - 1 %   Basophils Absolute 0.0 0.0 - 0.1 K/uL  Comprehensive metabolic panel     Status: Abnormal   Collection Time: 02/20/15  6:17 PM  Result Value Ref Range   Sodium 139 135 - 145 mmol/L   Potassium 4.2 3.5 - 5.1 mmol/L   Chloride 105 101 - 111 mmol/L   CO2 28 22 - 32 mmol/L   Glucose, Bld 114 (H) 65 - 99 mg/dL   BUN 10 6 - 20 mg/dL   Creatinine, Ser 0.72 0.44 - 1.00 mg/dL   Calcium 9.5 8.9 - 10.3 mg/dL   Total Protein 7.8 6.5 - 8.1 g/dL   Albumin 3.9 3.5 - 5.0 g/dL   AST 23 15 -  41 U/L   ALT 15 14 - 54 U/L   Alkaline Phosphatase 91 38 - 126 U/L   Total Bilirubin 0.6 0.3 - 1.2 mg/dL   GFR calc non Af Amer >60 >60 mL/min   GFR calc Af Amer >60 >60 mL/min    Comment: (NOTE) The eGFR has been calculated using the CKD EPI equation. This calculation has not been validated in all clinical situations. eGFR's persistently <60 mL/min signify possible Chronic Kidney Disease.    Anion gap 6 5 - 15  Lipase, blood     Status: Abnormal   Collection Time: 02/20/15  6:17 PM  Result Value Ref Range   Lipase 15 (L) 22 - 51 U/L  Urinalysis, Routine w reflex microscopic (not at [facility])     Status: Abnormal   Collection Time: 02/20/15  7:50 PM  Result Value Ref Range   Color, Urine YELLOW YELLOW   APPearance CLOUDY (A) CLEAR   Specific Gravity, Urine 1.021 1.005 - 1.030   pH 6.0 5.0 - 8.0   Glucose, UA NEGATIVE NEGATIVE mg/dL   Hgb urine dipstick TRACE (A) NEGATIVE   Bilirubin Urine NEGATIVE NEGATIVE   Ketones, ur NEGATIVE NEGATIVE mg/dL   Protein, ur 30 (A) NEGATIVE mg/dL   Urobilinogen, UA 0.2 0.0 - 1.0 mg/dL   Nitrite NEGATIVE NEGATIVE   Leukocytes, UA NEGATIVE NEGATIVE    Urine microscopic-add on     Status: None   Collection Time: 02/20/15  7:50 PM  Result Value Ref Range   Squamous Epithelial / LPF RARE RARE   RBC / HPF 0-2 <3 RBC/hpf   Ct Abdomen Pelvis W Contrast  02/20/2015   CLINICAL DATA:  Mid abdominal pain with epigastric pressure, nausea and vomiting since this morning. Previous hysterectomy. Initial encounter.  EXAM: CT ABDOMEN AND PELVIS WITH CONTRAST  TECHNIQUE: Multidetector CT imaging of the abdomen and pelvis was performed using the standard protocol following bolus administration of intravenous contrast.  CONTRAST:  161m OMNIPAQUE IOHEXOL 300 MG/ML  SOLN  COMPARISON:  None.  FINDINGS: Lower chest:  Mild atelectasis at the lung bases.  Hepatobiliary: 1.6 cm low-density lesion posteriorly in the right hepatic lobe on image 16 demonstrates peripheral enhancement and is not well visualized on the delayed post-contrast images. This probably represents a hemangioma. No other hepatic abnormalities identified. No evidence of gallstones, gallbladder wall thickening or biliary dilatation.  Pancreas: Unremarkable. No pancreatic ductal dilatation or surrounding inflammatory changes.  Spleen: Normal in size without focal abnormality.  Adrenals/Urinary Tract: Both adrenal glands appear normal. The kidneys appear normal without evidence of urinary tract calculus, suspicious lesion or hydronephrosis. No bladder abnormalities are seen.  Stomach/Bowel: The appendix is markedly distended to 1.7 cm and demonstrates wall thickening and surrounding inflammatory change. There is some free fluid within the pelvis, and appendiceal rupture suspected. No drainable abscess identified. There may be a small spilled appendicolith. There is no evidence of bowel obstruction or wall thickening of the small bowel or colon.  Vascular/Lymphatic: There are no enlarged abdominal or pelvic lymph nodes. No significant vascular findings are present.  Reproductive: Status post hysterectomy. No  adnexal mass. There are multiple pelvic phleboliths. As noted above, there is asymmetric free pelvic fluid on the right.  Other: No evidence of abdominal wall mass or hernia.  Musculoskeletal: No acute or significant osseous findings. Discogenic sclerosis noted in the lower thoracic spine. Probable left iliac bone bone island and left femoral neck synovial herniation pits noted incidentally.  IMPRESSION: 1. Acute appendicitis with  surrounding inflammatory changes suspicious for early perforation. No drainable abscess or bowel obstruction. 2. 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 3. These results were called by telephone at the time of interpretation on 02/20/2015 at 7:58 pm to Dr. Quintella Reichert , who verbally acknowledged these results.   Electronically Signed   By: Richardean Sale M.D.   On: 02/20/2015 19:58    Review of Systems  Constitutional: Negative.   HENT: Negative.   Eyes: Negative.   Respiratory: Negative.   Cardiovascular: Negative.   Gastrointestinal: Positive for vomiting and abdominal pain.  Genitourinary: Negative.   Musculoskeletal: Negative.   Skin: Negative.   Neurological: Negative.   Endo/Heme/Allergies: Negative.   Psychiatric/Behavioral: Negative.     Blood pressure 149/101, pulse 87, temperature 98.3 F (36.8 C), temperature source Oral, resp. rate 16, SpO2 97 %. Physical Exam  Constitutional: She is oriented to person, place, and time. She appears well-developed and well-nourished. No distress.  HENT:  Head: Normocephalic and atraumatic.  Eyes: Conjunctivae are normal. Pupils are equal, round, and reactive to light. Right eye exhibits no discharge. Left eye exhibits no discharge. No scleral icterus.  Neck: Neck supple. No thyromegaly present.  Cardiovascular: Normal rate and intact distal pulses.   Respiratory: Effort normal. No respiratory distress.  GI: Soft. Bowel sounds are normal. She exhibits no distension. There is tenderness  (RLQ/right suprapubic). There is guarding. There is no rebound.  Musculoskeletal: Normal range of motion. She exhibits no edema or tenderness.  Neurological: She is alert and oriented to person, place, and time.  Skin: Skin is warm. No rash noted. She is not diaphoretic. No erythema. No pallor.  Psychiatric: She has a normal mood and affect. Her behavior is normal. Judgment and thought content normal.     Assessment/Plan Acute appendicitis NPO IVF IV antibiotics To OR for lap appy.  Appendectomy was described to the patient.  The incisions and surgical technique were explained.  The patient was advised that some of the hair on the abdomen would be clipped, and that a foley catheter would be placed.  I advised the patient of the risks of surgery including, but not limited to, bleeding, infection, damage to other structures, risk of an open operation, risk of abscess, and risk of blood clot.  The recovery was also described to the patient.  She was advised that she will have lifting restrictions for 2 weeks.     Marah Park 02/20/2015, 9:19 PM

## 2015-02-20 NOTE — ED Notes (Signed)
Pt in short stay, MD at bedside

## 2015-02-20 NOTE — Transfer of Care (Signed)
Immediate Anesthesia Transfer of Care Note  Patient: Lauren Rogers  Procedure(s) Performed: Procedure(s): APPENDECTOMY LAPAROSCOPIC (N/A)  Patient Location: PACU  Anesthesia Type:General  Level of Consciousness: awake and pateint uncooperative  Airway & Oxygen Therapy: Patient Spontanous Breathing and Patient connected to nasal cannula oxygen  Post-op Assessment: Report given to RN and Post -op Vital signs reviewed and stable  Post vital signs: Reviewed and stable  Last Vitals:  Filed Vitals:   02/20/15 2053  BP: 149/101  Pulse: 87  Temp:   Resp: 16    Complications: No apparent anesthesia complications

## 2015-02-20 NOTE — ED Notes (Signed)
Lab contacted to draw blood.

## 2015-02-20 NOTE — ED Notes (Signed)
Pt here from [facility] where she was seen for abdominal pain in upper abdomen which [facility] MD wanted her to be further evaluated for concern of gallbladder issues.

## 2015-02-20 NOTE — Anesthesia Procedure Notes (Signed)
Procedure Name: Intubation Date/Time: 02/20/2015 9:43 PM Performed by: Julianne Rice Z Pre-anesthesia Checklist: Patient identified, Timeout performed, Emergency Drugs available, Suction available and Patient being monitored Patient Re-evaluated:Patient Re-evaluated prior to inductionOxygen Delivery Method: Circle system utilized Preoxygenation: Pre-oxygenation with 100% oxygen Intubation Type: IV induction, Rapid sequence and Cricoid Pressure applied Laryngoscope Size: Mac and 3 Grade View: Grade II Tube type: Oral Tube size: 7.5 mm Number of attempts: 1 Airway Equipment and Method: Stylet Placement Confirmation: ETT inserted through vocal cords under direct vision,  breath sounds checked- equal and bilateral and positive ETCO2 Secured at: 22 cm Dental Injury: Teeth and Oropharynx as per pre-operative assessment

## 2015-02-21 ENCOUNTER — Encounter (HOSPITAL_COMMUNITY): Payer: Self-pay | Admitting: *Deleted

## 2015-02-21 MED ORDER — METRONIDAZOLE IN NACL 5-0.79 MG/ML-% IV SOLN
500.0000 mg | Freq: Three times a day (TID) | INTRAVENOUS | Status: DC
  Administered 2015-02-21 – 2015-02-22 (×4): 500 mg via INTRAVENOUS
  Filled 2015-02-21 (×7): qty 100

## 2015-02-21 MED ORDER — ONDANSETRON HCL 4 MG/2ML IJ SOLN: 4.0000 mg | Freq: Four times a day (QID) | INTRAMUSCULAR | Status: DC | PRN

## 2015-02-21 MED ORDER — PREDNISONE 20 MG PO TABS
40.0000 mg | ORAL_TABLET | Freq: Every day | ORAL | Status: DC
  Filled 2015-02-21 (×2): qty 2

## 2015-02-21 MED ORDER — MORPHINE SULFATE (PF) 2 MG/ML IV SOLN
1.0000 mg | INTRAVENOUS | Status: DC | PRN
  Administered 2015-02-21 – 2015-02-22 (×2): 2 mg via INTRAVENOUS
  Filled 2015-02-21 (×2): qty 1

## 2015-02-21 MED ORDER — DIPHENHYDRAMINE HCL 50 MG/ML IJ SOLN: 25.0000 mg | Freq: Four times a day (QID) | INTRAMUSCULAR | Status: DC | PRN

## 2015-02-21 MED ORDER — PANTOPRAZOLE SODIUM 40 MG PO TBEC
40.0000 mg | DELAYED_RELEASE_TABLET | Freq: Every day | ORAL | Status: DC
  Administered 2015-02-21 – 2015-02-22 (×2): 40 mg via ORAL
  Filled 2015-02-21 (×2): qty 1

## 2015-02-21 MED ORDER — BISACODYL 10 MG RE SUPP: 10.0000 mg | Freq: Every day | RECTAL | Status: DC | PRN

## 2015-02-21 MED ORDER — HYDRALAZINE HCL 20 MG/ML IJ SOLN: 10.0000 mg | INTRAMUSCULAR | Status: DC | PRN

## 2015-02-21 MED ORDER — MORPHINE SULFATE (PF) 2 MG/ML IV SOLN
1.0000 mg | INTRAVENOUS | Status: DC | PRN
  Administered 2015-02-21 (×2): 2 mg via INTRAVENOUS
  Filled 2015-02-21 (×2): qty 1

## 2015-02-21 MED ORDER — ONDANSETRON 4 MG PO TBDP: 4.0000 mg | ORAL_TABLET | Freq: Four times a day (QID) | ORAL | Status: DC | PRN

## 2015-02-21 MED ORDER — CLONAZEPAM 1 MG PO TABS: 2.0000 mg | ORAL_TABLET | Freq: Every day | ORAL | Status: DC | PRN

## 2015-02-21 MED ORDER — ENOXAPARIN SODIUM 40 MG/0.4ML ~~LOC~~ SOLN
40.0000 mg | SUBCUTANEOUS | Status: DC
Start: 2015-02-21 — End: 2015-02-22
  Administered 2015-02-21: 40 mg via SUBCUTANEOUS
  Filled 2015-02-21: qty 0.4

## 2015-02-21 MED ORDER — METHOCARBAMOL 500 MG PO TABS: 500.0000 mg | ORAL_TABLET | Freq: Four times a day (QID) | ORAL | Status: DC | PRN

## 2015-02-21 MED ORDER — SERTRALINE HCL 50 MG PO TABS
50.0000 mg | ORAL_TABLET | Freq: Every day | ORAL | Status: DC
  Administered 2015-02-21 (×2): 50 mg via ORAL
  Filled 2015-02-21 (×2): qty 1

## 2015-02-21 MED ORDER — DOCUSATE SODIUM 100 MG PO CAPS
100.0000 mg | ORAL_CAPSULE | Freq: Two times a day (BID) | ORAL | Status: DC
Start: 2015-02-21 — End: 2015-02-22
  Administered 2015-02-21 – 2015-02-22 (×4): 100 mg via ORAL
  Filled 2015-02-21 (×4): qty 1

## 2015-02-21 MED ORDER — HYDROXYZINE HCL 10 MG PO TABS
10.0000 mg | ORAL_TABLET | Freq: Three times a day (TID) | ORAL | Status: DC | PRN
  Filled 2015-02-21: qty 1

## 2015-02-21 MED ORDER — MECLIZINE HCL 25 MG PO TABS
25.0000 mg | ORAL_TABLET | Freq: Three times a day (TID) | ORAL | Status: DC | PRN
  Filled 2015-02-21: qty 1

## 2015-02-21 MED ORDER — BUPROPION HCL ER (XL) 150 MG PO TB24
300.0000 mg | ORAL_TABLET | Freq: Every day | ORAL | Status: DC
  Administered 2015-02-22: 300 mg via ORAL
  Filled 2015-02-21 (×2): qty 2

## 2015-02-21 MED ORDER — FAMOTIDINE 20 MG PO TABS: 20.0000 mg | ORAL_TABLET | Freq: Two times a day (BID) | ORAL | Status: DC | PRN

## 2015-02-21 MED ORDER — SIMETHICONE 80 MG PO CHEW: 40.0000 mg | CHEWABLE_TABLET | Freq: Four times a day (QID) | ORAL | Status: DC | PRN

## 2015-02-21 MED ORDER — DEXTROSE 5 % IV SOLN
2.0000 g | INTRAVENOUS | Status: DC
  Administered 2015-02-21: 2 g via INTRAVENOUS
  Filled 2015-02-21 (×2): qty 2

## 2015-02-21 MED ORDER — IBUPROFEN 600 MG PO TABS
600.0000 mg | ORAL_TABLET | Freq: Four times a day (QID) | ORAL | Status: DC | PRN
  Administered 2015-02-21: 600 mg via ORAL
  Filled 2015-02-21: qty 1

## 2015-02-21 MED ORDER — MELOXICAM 7.5 MG PO TABS
15.0000 mg | ORAL_TABLET | Freq: Every day | ORAL | Status: DC
  Filled 2015-02-21 (×2): qty 2

## 2015-02-21 MED ORDER — DIPHENHYDRAMINE HCL 25 MG PO CAPS: 25.0000 mg | ORAL_CAPSULE | Freq: Four times a day (QID) | ORAL | Status: DC | PRN

## 2015-02-21 MED ORDER — TRIAZOLAM 0.25 MG PO TABS: 0.2500 mg | ORAL_TABLET | Freq: Every evening | ORAL | Status: DC | PRN

## 2015-02-21 MED ORDER — HYDROCODONE-ACETAMINOPHEN 5-325 MG PO TABS
1.0000 | ORAL_TABLET | ORAL | Status: DC | PRN
  Filled 2015-02-21: qty 1

## 2015-02-21 NOTE — Progress Notes (Signed)
[facility] Progress Note  1 Day Post-Op  Subjective: Pt having a lot of pain, mild nausea, but no vomiting.  Ambulating well to the bathroom.  Family at bedside.  Anorexia, but says she's looking forward to lunch.  Financial advisor just came in.    Objective: Vital signs in last 24 hours: Temp:  [97.9 F (36.6 C)-100.8 F (38.2 C)] 98.5 F (36.9 C) (08/29 0848) Pulse Rate:  [76-106] 106 (08/29 0848) Resp:  [16-21] 18 (08/29 0848) BP: (119-167)/(63-101) 125/67 mmHg (08/29 0848) SpO2:  [90 %-100 %] 98 % (08/29 0848)    Intake/Output from previous day: 08/28 0701 - 08/29 0700 In: 1840 [P.O.:240; I.V.:1600] Out: 330 [Urine:300; Blood:30] Intake/Output this shift: Total I/O In: 711 [I.V.:711] Out: -   PE: Gen:  Alert, NAD, pleasant Abd: Soft, mild distension, mod tenderness, +BS, no HSM, incisions C/D/I with dermabond in place   Lab Results:   Recent Labs  02/20/15 1817  WBC 13.6*  HGB 13.6  HCT 40.8  PLT 235   BMET  Recent Labs  02/20/15 1817  NA 139  K 4.2  CL 105  CO2 28  GLUCOSE 114*  BUN 10  CREATININE 0.72  CALCIUM 9.5   PT/INR No results for input(s): LABPROT, INR in the last 72 hours. CMP     Component Value Date/Time   NA 139 02/20/2015 1817   K 4.2 02/20/2015 1817   CL 105 02/20/2015 1817   CO2 28 02/20/2015 1817   GLUCOSE 114* 02/20/2015 1817   BUN 10 02/20/2015 1817   CREATININE 0.72 02/20/2015 1817   CALCIUM 9.5 02/20/2015 1817   PROT 7.8 02/20/2015 1817   ALBUMIN 3.9 02/20/2015 1817   AST 23 02/20/2015 1817   ALT 15 02/20/2015 1817   ALKPHOS 91 02/20/2015 1817   BILITOT 0.6 02/20/2015 1817   GFRNONAA >60 02/20/2015 1817   GFRAA >60 02/20/2015 1817   Lipase     Component Value Date/Time   LIPASE 15* 02/20/2015 1817       Studies/Results: Ct Abdomen Pelvis W Contrast  02/20/2015   CLINICAL DATA:  Mid abdominal pain with epigastric pressure, nausea and vomiting since this morning. Previous hysterectomy.  Initial encounter.  EXAM: CT ABDOMEN AND PELVIS WITH CONTRAST  TECHNIQUE: Multidetector CT imaging of the abdomen and pelvis was performed using the standard protocol following bolus administration of intravenous contrast.  CONTRAST:  <MEASUREME09Conservation [facility] GablesGastroen515Kentuc81<MEASUREMENT09Conservation [facility] Vet813Kentuc81<MEASUREMENT09Conservation [facility] 9Kentuc81<MEASUREMENT09Conservatio881<MEASUREMENT09Conservation [facility]ospFl[facility] (518Kentuc81<MEASUREMENT09Conservation [facility] Ce(714Kentuc81<MEASUREMENT09Conservation [facility] 443Kentuc81<MEASUREMENT09Conservation [facility] CeSp[facility]772Kentuc81<MEASUREMENT09Conservation [facility]osp[facility] Special(938Kentuc81<MEASUREMENT09Conservation [facility] CeOur [facility] Of L(419)Kentuc81<MEASUREMENT09Conservation [facility]416Kentuc81<MEASUREMENT09Conservation [facility] Ce[facility] Al(906Kentuc81<MEASUREMENT09Conservation [facility] H858Kentuc81<MEASUREMENT09Conservation [facility]707Kentuc81<MEASUREMENT09Conservation [facility]4Kentuc81<MEASUREMENT09Conservation [facility] AshtoNovant Health780Kentuc81<MEASUREMENT09Conservation [facility] CeSaint Barnabas 435Kentuc81<MEASUREMENT09Conservation [facility] HospSheep934Kentuc81<MEASUREMENT09Conservation [facility] SuChi(262Kentuc81<MEASUREMENT09Conservation [facility] LankTem[facility] Univ7Kentuc81<MEASUREMENT09Conservation [facility] CenterDigestive Dise(580)Kentuc81<MEASUREMENT09Conservation [facility] CeKo386Kentuc81<MEASUREMENT09Conservation [facility] HospMounta404Kentuc81<MEASUREMENT09Conservation [facility] HospEme269Kentuc81<MEASUREMENT09Conservation [facility] PMat-Su(854)Kentuc81<MEASUREMENT09Conservation [facility] VThe En(360Kentuc81<MEASUREMENT09Conservation [facility] CharterSt. L8Kentuc81<MEASUREMENT09Conservation [facility] HospNo216Kentuc81<MEASUREMENT09Conservation [facility] VSt Lucys Outpa970Kentuc81<MEASUREMENT09Conservation [facility] Ho4Kentuc81<MEASUREMENT09Conservation [facility]810Kentuc81<MEASUREMENT09Conservation [facility] CeSm857Kentuc81<MEASUREMENT09Conservation [facility] At([id number])[phone]  SOLN  COMPARISON:  None.  FINDINGS: Lower chest:  Mild atelectasis at the lung bases.  Hepatobiliary: 1.6 cm low-density lesion posteriorly in the right hepatic lobe on image 16 demonstrates peripheral enhancement and is not well visualized on the delayed post-contrast images. This probably represents a hemangioma. No other hepatic abnormalities identified. No evidence of gallstones, gallbladder wall thickening or biliary dilatation.  Pancreas: Unremarkable. No pancreatic ductal dilatation or surrounding inflammatory changes.  Spleen: Normal in size without focal abnormality.  Adrenals/Urinary Tract: Both adrenal glands appear normal. The kidneys appear normal without evidence of urinary tract calculus, suspicious lesion or hydronephrosis. No bladder abnormalities are seen.  Stomach/Bowel: The appendix is markedly distended to 1.7 cm and demonstrates wall thickening and surrounding inflammatory change. There is some free fluid within the pelvis, and appendiceal rupture suspected. No drainable abscess identified. There may be a small spilled appendicolith. There is no evidence of bowel obstruction or wall thickening of the small bowel or colon.  Vascular/Lymphatic: There are no enlarged abdominal or pelvic lymph nodes. No significant vascular findings  are present.  Reproductive: Status post hysterectomy. No adnexal mass. There are multiple pelvic phleboliths. As noted above, there is asymmetric free pelvic fluid on the right.  Other: No evidence of abdominal wall mass or hernia.  Musculoskeletal: No acute or significant osseous findings. Discogenic sclerosis noted in the lower thoracic spine. Probable left iliac bone bone island and left femoral neck  synovial herniation pits noted incidentally.  IMPRESSION: 1. Acute appendicitis with surrounding inflammatory changes suspicious for early perforation. No drainable abscess or bowel obstruction. 2. 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 3. These results were called by telephone at the time of interpretation on 02/20/2015 at 7:58 pm to Dr. Tilden Fossa , who verbally acknowledged these results.   Electronically Signed   By: Carey Bullocks M.D.   On: 02/20/2015 19:58    Anti-infectives: Anti-infectives    Start     Dose/Rate Route Frequency Ordered Stop   02/21/15 2100  cefTRIAXone (ROCEPHIN) 2 g in dextrose 5 % 50 mL IVPB     2 g 100 mL/hr over 30 Minutes Intravenous Every 24 hours 02/21/15 0017     02/21/15 0600  cefTRIAXone (ROCEPHIN) 2 g in dextrose 5 % 50 mL IVPB    Comments:  Give in OR holding.    Pharmacy may adjust dosing strength per protocol   2 g 100 mL/hr over 30 Minutes Intravenous On call to O.R. 02/20/15 2028 02/21/15 0753   02/21/15 0400  metroNIDAZOLE (FLAGYL) IVPB 500 mg     500 mg 100 mL/hr over 60 Minutes Intravenous Every 8 hours 02/21/15 0017     02/20/15 2030  metroNIDAZOLE (FLAGYL) IVPB 500 mg  Status:  Discontinued     500 mg 100 mL/hr over 60 Minutes Intravenous Every 8 hours 02/20/15 2028 02/21/15 0020       Assessment/Plan Acute appendicitis POD #1 s/p lap appy with LOA -Still in a lot of discomfort, some nausea. -Continue antibiotics for total of 5 days -Ambulate and IS -SCD's and lovenox -Antiemetics, IVF, pain control -Encouraged orals, wean IV pain meds -Plan for d/c later today or tomorrow if not improving -Post-op appt arranged.      Nonie Hoyer 02/21/2015, 9:55 AM Pager: [phone]

## 2015-02-21 NOTE — Anesthesia Postprocedure Evaluation (Signed)
  Anesthesia Post-op Note  Patient: Lauren Rogers  Procedure(s) Performed: Procedure(s): APPENDECTOMY LAPAROSCOPIC (N/A)  Patient Location: PACU  Anesthesia Type:General  Level of Consciousness: awake, alert  and oriented  Airway and Oxygen Therapy: Patient Spontanous Breathing and Patient connected to nasal cannula oxygen  Post-op Pain: mild  Post-op Assessment: Post-op Vital signs reviewed, Patient's Cardiovascular Status Stable, Respiratory Function Stable, Patent Airway and Pain level controlled              Post-op Vital Signs: stable  Last Vitals:  Filed Vitals:   02/20/15 2352  BP:   Pulse: 77  Temp: 36.8 C  Resp: 21    Complications: No apparent anesthesia complications

## 2015-02-22 MED ORDER — CIPROFLOXACIN HCL 500 MG PO TABS: 500.0000 mg | ORAL_TABLET | Freq: Two times a day (BID) | ORAL | Status: DC

## 2015-02-22 MED ORDER — METRONIDAZOLE 500 MG PO TABS: 500.0000 mg | ORAL_TABLET | Freq: Three times a day (TID) | ORAL | Status: DC

## 2015-02-22 MED ORDER — DOCUSATE SODIUM 100 MG PO CAPS: 100.0000 mg | ORAL_CAPSULE | Freq: Two times a day (BID) | ORAL | Status: AC | PRN

## 2015-02-22 MED ORDER — METHOCARBAMOL 500 MG PO TABS: 500.0000 mg | ORAL_TABLET | Freq: Four times a day (QID) | ORAL | Status: DC | PRN

## 2015-02-22 MED ORDER — HYDROCODONE-ACETAMINOPHEN 5-325 MG PO TABS: 1.0000 | ORAL_TABLET | Freq: Four times a day (QID) | ORAL | Status: DC | PRN

## 2015-02-22 NOTE — Progress Notes (Signed)
Discharge paperwork given to patient. No questions verbalized. Patient is awaiting transport.

## 2015-02-22 NOTE — Discharge Summary (Signed)
[facility] Discharge Summary   Patient ID: Lauren Rogers MRN: [id number] DOB/AGE: December 10, 1955 59 y.o.  Admit date: 02/20/2015 Discharge date: 02/22/2015  Admitting Diagnosis: Acute appendicitis  Discharge Diagnosis Patient Active Problem List   Diagnosis Date Noted  . Acute appendicitis 02/20/2015  . Chest pain 05/12/2014  . Tobacco abuse 05/12/2014  . Depression 05/12/2014  . Anxiety 05/12/2014    Consultants None  Imaging: Ct Abdomen Pelvis W Contrast  02/20/2015   CLINICAL DATA:  Mid abdominal pain with epigastric pressure, nausea and vomiting since this morning. Previous hysterectomy. Initial encounter.  EXAM: CT ABDOMEN AND PELVIS WITH CONTRAST  TECHNIQUE: Multidetector CT imaging of the abdomen and pelvis was performed using the standard protocol following bolus administration of intravenous contrast.  CONTRAST:  OMNIPAQUE IOHEXOL 300 MG/ML  SOLN  COMPARISON:  None.  FINDINGS: Lower chest:  Mild atelectasis at the lung bases.  Hepatobiliary: 1.6 cm low-density lesion posteriorly in the right hepatic lobe on image 16 demonstrates peripheral enhancement and is not well visualized on the delayed post-contrast images. This probably represents a hemangioma. No other hepatic abnormalities identified. No evidence of gallstones, gallbladder wall thickening or biliary dilatation.  Pancreas: Unremarkable. No pancreatic ductal dilatation or surrounding inflammatory changes.  Spleen: Normal in size without focal abnormality.  Adrenals/Urinary Tract: Both adrenal glands appear normal. The kidneys appear normal without evidence of urinary tract calculus, suspicious lesion or hydronephrosis. No bladder abnormalities are seen.  Stomach/Bowel: The appendix is markedly distended to 1.7 cm and demonstrates wall thickening and surrounding inflammatory change. There is some free fluid within the pelvis, and appendiceal rupture suspected. No drainable abscess identified. There may be a  small spilled appendicolith. There is no evidence of bowel obstruction or wall thickening of the small bowel or colon.  Vascular/Lymphatic: There are no enlarged abdominal or pelvic lymph nodes. No significant vascular findings are present.  Reproductive: Status post hysterectomy. No adnexal mass. There are multiple pelvic phleboliths. As noted above, there is asymmetric free pelvic fluid on the right.  Other: No evidence of abdominal wall mass or hernia.  Musculoskeletal: No acute or significant osseous findings. Discogenic sclerosis noted in the lower thoracic spine. Probable left iliac bone bone island and left femoral neck synovial herniation pits noted incidentally.  IMPRESSION: 1. Acute appendicitis with surrounding inflammatory changes suspicious for early perforation. No drainable abscess or bowel obstruction. 2. 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 3. These results were called by telephone at the time of interpretation on 02/20/2015 at 7:58 pm to Dr. Tilden Fossa , who verbally acknowledged these results.   Electronically Signed   By: Carey Bullocks M.D.   On: 02/20/2015 19:58    Procedures Dr. Donell Beers (02/20/15) - Laparoscopic Appendectomy with extensive LOA  Hospital Course:  59 y/o AA female who presented to [facility] with 12-18 hours of severe abdominal pain. It started in the upper abdomen, then migrated to the RLQ. She has not had fevers, but has had chills. She has had nausea and vomiting. The pain has gotten worse over the day. Pain is worse with movement, better at rest. It is also worse with lying flat.   Workup showed acute appendicitis.  Patient was admitted and underwent procedure listed above.  Appendix was very friable and there was lots of scar tissue that had to be lysed.  She tolerated procedure well and was transferred to the floor.  Post-operatively she had N/V and pain control issues.  This soon resolved.  Diet was advanced as tolerated.  On  POD #2, the patient was voiding well, tolerating diet, ambulating well, pain well controlled, vital signs stable, incisions c/d/i and felt stable for discharge home.  Patient will follow up in our office in 3 weeks and knows to call with questions or concerns.  She will finish out a full 5 days of antibiotics.  I have given her a prescription for cipro/flagyl to go home with.   Physical Exam: General:  Alert, NAD, pleasant, comfortable Abd:  Soft, ND, mild tenderness, incisions C/D/I, ecchymosis to umbilicus incision and mild erythema around other incision sites.    Medication List    TAKE these medications        buPROPion 300 MG 24 hr tablet  Commonly known as:  WELLBUTRIN XL  Take 300 mg by mouth daily.     ciprofloxacin 500 MG tablet  Commonly known as:  CIPRO  Take 1 tablet (500 mg total) by mouth 2 (two) times daily.     clonazePAM 2 MG tablet  Commonly known as:  KLONOPIN  Take 2 mg by mouth daily as needed for anxiety.     docusate sodium 100 MG capsule  Commonly known as:  COLACE  Take 1 capsule (100 mg total) by mouth 2 (two) times daily as needed for mild constipation.     HYDROcodone-acetaminophen 5-325 MG per tablet  Commonly known as:  NORCO  Take 1-2 tablets by mouth every 6 (six) hours as needed for moderate pain or severe pain.     meclizine 50 MG tablet  Commonly known as:  ANTIVERT  Take 0.5 tablets (25 mg total) by mouth 3 (three) times daily as needed.     meloxicam 15 MG tablet  Commonly known as:  MOBIC  Take 1 tablet (15 mg total) by mouth daily.     methocarbamol 500 MG tablet  Commonly known as:  ROBAXIN  Take 1 tablet (500 mg total) by mouth every 6 (six) hours as needed for muscle spasms.     metroNIDAZOLE 500 MG tablet  Commonly known as:  FLAGYL  Take 1 tablet (500 mg total) by mouth 3 (three) times daily.     omeprazole 40 MG capsule  Commonly known as:  PRILOSEC  Take 1 capsule (40 mg total) by mouth daily.     PEPTO-BISMOL PO  Take  by mouth.         Follow-up Information    Follow up with [facility] [place]. Go on 03/14/2015.   Why:  For post-operation check. Your appointment is at 1:30pm, please arrive at least 30 min before your appointment to complete your check in paperwork.  If you are unable to arrive 30 min prior to your appointment time we may have to cancel or reschedule you   Contact information:   [address] [place] [address] [phone]      Signed: Nonie Hoyer, [facility] [phone]  02/22/2015, 9:27 AM

## 2015-02-22 NOTE — Discharge Instructions (Signed)

## 2015-05-24 ENCOUNTER — Ambulatory Visit
Admission: RE | Admit: 2015-05-24 | Discharge: 2015-05-24 | Disposition: A | Discharge status: Home / Self Care | Payer: Self-pay | Source: Ambulatory Visit | Attending: Physician Assistant | Admitting: Physician Assistant

## 2015-05-24 ENCOUNTER — Other Ambulatory Visit: Payer: Self-pay | Admitting: Physician Assistant

## 2015-05-24 DIAGNOSIS — M25561 Pain in right knee: Secondary | ICD-10-CM

## 2016-06-19 ENCOUNTER — Other Ambulatory Visit: Payer: Self-pay | Admitting: Physician Assistant

## 2016-06-19 ENCOUNTER — Other Ambulatory Visit (HOSPITAL_COMMUNITY)
Admission: RE | Admit: 2016-06-19 | Discharge: 2016-06-19 | Disposition: A | Discharge status: Home / Self Care | Payer: Self-pay | Source: Ambulatory Visit | Attending: Physician Assistant | Admitting: Physician Assistant

## 2016-06-19 DIAGNOSIS — Z0000 Encounter for general adult medical examination without abnormal findings: Secondary | ICD-10-CM | POA: Insufficient documentation

## 2016-06-19 DIAGNOSIS — Z1151 Encounter for screening for human papillomavirus (HPV): Secondary | ICD-10-CM | POA: Insufficient documentation

## 2016-06-21 LAB — CYTOLOGY - PAP
Adequacy: ABSENT
BACTERIAL VAGINITIS: NEGATIVE
Candida vaginitis: NEGATIVE
DIAGNOSIS: NEGATIVE
HPV (WINDOPATH): NOT DETECTED

## 2016-09-04 ENCOUNTER — Encounter (HOSPITAL_COMMUNITY): Payer: Self-pay | Admitting: Emergency Medicine

## 2016-09-04 ENCOUNTER — Emergency Department (HOSPITAL_COMMUNITY)
Admission: EM | Admit: 2016-09-04 | Discharge: 2016-09-04 | Disposition: A | Discharge status: Home / Self Care | Payer: Self-pay | Attending: Emergency Medicine | Admitting: Emergency Medicine

## 2016-09-04 DIAGNOSIS — R109 Unspecified abdominal pain: Secondary | ICD-10-CM | POA: Insufficient documentation

## 2016-09-04 DIAGNOSIS — F17210 Nicotine dependence, cigarettes, uncomplicated: Secondary | ICD-10-CM | POA: Insufficient documentation

## 2016-09-04 DIAGNOSIS — R112 Nausea with vomiting, unspecified: Secondary | ICD-10-CM | POA: Insufficient documentation

## 2016-09-04 DIAGNOSIS — H81391 Other peripheral vertigo, right ear: Secondary | ICD-10-CM | POA: Insufficient documentation

## 2016-09-04 LAB — URINALYSIS, ROUTINE W REFLEX MICROSCOPIC
Bacteria, UA: NONE SEEN
Bilirubin Urine: NEGATIVE
GLUCOSE, UA: NEGATIVE mg/dL
HGB URINE DIPSTICK: NEGATIVE
KETONES UR: NEGATIVE mg/dL
LEUKOCYTES UA: NEGATIVE
Nitrite: NEGATIVE
PROTEIN: 100 mg/dL — AB
Specific Gravity, Urine: 1.014 (ref 1.005–1.030)
pH: 8 (ref 5.0–8.0)

## 2016-09-04 LAB — COMPREHENSIVE METABOLIC PANEL
ALT: 13 U/L — ABNORMAL LOW (ref 14–54)
ANION GAP: 9 (ref 5–15)
AST: 20 U/L (ref 15–41)
Albumin: 4 g/dL (ref 3.5–5.0)
Alkaline Phosphatase: 87 U/L (ref 38–126)
BILIRUBIN TOTAL: 0.5 mg/dL (ref 0.3–1.2)
BUN: 7 mg/dL (ref 6–20)
CHLORIDE: 105 mmol/L (ref 101–111)
CO2: 25 mmol/L (ref 22–32)
Calcium: 9.3 mg/dL (ref 8.9–10.3)
Creatinine, Ser: 0.76 mg/dL (ref 0.44–1.00)
Glucose, Bld: 147 mg/dL — ABNORMAL HIGH (ref 65–99)
Potassium: 3.7 mmol/L (ref 3.5–5.1)
Sodium: 139 mmol/L (ref 135–145)
TOTAL PROTEIN: 7.7 g/dL (ref 6.5–8.1)

## 2016-09-04 LAB — CBC WITH DIFFERENTIAL/PLATELET
BASOS ABS: 0 10*3/uL (ref 0.0–0.1)
Basophils Relative: 1 %
EOS PCT: 1 %
Eosinophils Absolute: 0 10*3/uL (ref 0.0–0.7)
HEMATOCRIT: 38.4 % (ref 36.0–46.0)
HEMOGLOBIN: 12.6 g/dL (ref 12.0–15.0)
LYMPHS ABS: 2.9 10*3/uL (ref 0.7–4.0)
Lymphocytes Relative: 46 %
MCH: 26.3 pg (ref 26.0–34.0)
MCHC: 32.8 g/dL (ref 30.0–36.0)
MCV: 80.2 fL (ref 78.0–100.0)
Monocytes Absolute: 0.4 10*3/uL (ref 0.1–1.0)
Monocytes Relative: 6 %
NEUTROS ABS: 2.8 10*3/uL (ref 1.7–7.7)
NEUTROS PCT: 46 %
PLATELETS: 233 10*3/uL (ref 150–400)
RBC: 4.79 MIL/uL (ref 3.87–5.11)
RDW: 14.8 % (ref 11.5–15.5)
WBC: 6.1 10*3/uL (ref 4.0–10.5)

## 2016-09-04 LAB — I-STAT CG4 LACTIC ACID, ED: LACTIC ACID, VENOUS: 1.27 mmol/L (ref 0.5–1.9)

## 2016-09-04 MED ORDER — MECLIZINE HCL 25 MG PO TABS
25.0000 mg | ORAL_TABLET | Freq: Once | ORAL | Status: AC
  Administered 2016-09-04: 25 mg via ORAL
  Filled 2016-09-04: qty 1

## 2016-09-04 MED ORDER — ONDANSETRON 4 MG PO TBDP
ORAL_TABLET | ORAL | Status: AC
  Filled 2016-09-04: qty 1

## 2016-09-04 MED ORDER — MECLIZINE HCL 25 MG PO TABS: 25.0000 mg | ORAL_TABLET | Freq: Three times a day (TID) | ORAL | 0 refills | Status: DC | PRN

## 2016-09-04 MED ORDER — ONDANSETRON 4 MG PO TBDP
4.0000 mg | ORAL_TABLET | Freq: Once | ORAL | Status: AC
  Administered 2016-09-04: 4 mg via ORAL

## 2016-09-04 NOTE — ED Triage Notes (Signed)
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 Pt a/ox4, resp e/u, nad.

## 2016-09-04 NOTE — ED Provider Notes (Signed)
[facility] Provider Note   CSN: [id number] Arrival date & time: 09/04/16  1745     History   Chief Complaint Chief Complaint  Patient presents with  . Headache  . Emesis    HPI Lauren Rogers is a 61 y.o. female.  The history is provided by the patient.  URI   This is a new problem. The current episode started yesterday. The problem has not changed since onset.There has been no fever. Associated symptoms include abdominal pain, nausea, vomiting, congestion, rhinorrhea, sinus pain and sore throat. Pertinent negatives include no headaches and no cough.  Abdominal Pain   This is a new problem. The current episode started 3 to 5 hours ago. The problem occurs constantly. The problem has been resolved. Associated with: emesis. The pain is located in the epigastric region. The quality of the pain is cramping. The pain is mild. Associated symptoms include nausea and vomiting. Pertinent negatives include fever and headaches. Nothing aggravates the symptoms. The symptoms are relieved by vomiting.  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  Past Medical History:  Diagnosis Date  . Anxiety   . Depression     Patient Active Problem List   Diagnosis Date Noted  . Acute appendicitis 02/20/2015  . Chest pain 05/12/2014  . Tobacco abuse 05/12/2014  . Depression 05/12/2014  . Anxiety 05/12/2014    Past Surgical History:  Procedure Laterality Date  . ABDOMINAL HYSTERECTOMY  1998  . LAPAROSCOPIC APPENDECTOMY N/A 02/20/2015   Procedure: APPENDECTOMY LAPAROSCOPIC;  Surgeon: Almond Lint, MD;  Location: [facility] OR;  Service: General;  Laterality: N/A;    OB History    Gravida Para Term Preterm AB Living   4 2 2   2 2    SAB TAB Ectopic Multiple Live Births   2                 Home Medications    Prior to Admission medications   Medication Sig Start Date End Date Taking? Authorizing Provider  BuPROPion HCl (WELLBUTRIN PO) Take 1 tablet by mouth daily.   Yes Historical Provider, MD  LISINOPRIL PO Take 1 tablet by mouth daily.   Yes Historical Provider, MD  SERTRALINE HCL PO Take 1 tablet by mouth daily.   Yes Historical Provider, MD  buPROPion (WELLBUTRIN XL) 300 MG 24 hr tablet Take 300 mg by mouth daily.    Historical Provider, MD  docusate sodium (COLACE) 100 MG capsule Take 1 capsule (100 mg total) by mouth 2 (two) times daily as needed for mild constipation. Patient not taking: Reported on 09/04/2016 02/22/15   Nonie Hoyer, PA-C  HYDROcodone-acetaminophen (NORCO) 5-325 MG per tablet Take 1-2 tablets by mouth every 6 (six) hours as needed for moderate pain or severe pain. Patient not taking: Reported on 09/04/2016 02/22/15   Nonie Hoyer, PA-C  meclizine (ANTIVERT) 25 MG tablet Take 1 tablet (25 mg total) by mouth 3 (three) times daily as needed for dizziness. 09/04/16   Nira Conn, MD  meloxicam (MOBIC) 15 MG tablet Take 1 tablet (15 mg total) by mouth daily. Patient not taking: Reported on 02/20/2015 07/31/14   Charm Rings, MD  methocarbamol (ROBAXIN) 500 MG tablet Take 1 tablet (500 mg total) by mouth every 6 (six) hours as needed for muscle  spasms. Patient not taking: Reported on 09/04/2016 02/22/15   Nonie Hoyer, PA-C  omeprazole (PRILOSEC) 40 MG capsule Take 1 capsule (40 mg total) by mouth daily. Patient not taking: Reported on 02/20/2015 05/13/14   Calvert Cantor, MD    Family History Family History  Problem Relation Age of Onset  . Diabetes Father   . Cancer Father     prostate, colon    Social History Social History  Substance Use Topics  . Smoking status: Current Every Day Smoker    Packs/day: 0.25    Years: 20.00    Types: Cigarettes  . Smokeless tobacco: Never Used  . Alcohol use Yes     Comment: occassionally      Allergies   Patient has no known allergies.   Review of Systems Review of Systems  Constitutional: Negative for fever.  HENT: Positive for congestion, rhinorrhea, sinus pain and sore throat.   Respiratory: Negative for cough.   Gastrointestinal: Positive for abdominal pain, nausea and vomiting.  Neurological: Positive for dizziness. Negative for headaches.  Ten systems are reviewed and are negative for acute change except as noted in the HPI    Physical Exam Updated Vital Signs BP (!) 165/101   Pulse 67   Temp 97.3 F (36.3 C) (Oral)   Resp 20   SpO2 97%   Physical Exam  Constitutional: She is oriented to person, place, and time. She appears well-developed and well-nourished. No distress.  HENT:  Head: Normocephalic and atraumatic.  Nose: Nose normal.  Eyes: Conjunctivae and EOM are normal. Pupils are equal, round, and reactive to light. Right eye exhibits no discharge. Left eye exhibits no discharge. No scleral icterus.  Neck: Normal range of motion. Neck supple.  Cardiovascular: Normal rate and regular rhythm.  Exam reveals no gallop and no friction rub.   No murmur heard. Pulmonary/Chest: Effort normal and breath sounds normal. No stridor. No respiratory distress. She has no rales.  Abdominal: Soft. She exhibits no distension. There is no tenderness. There is no rigidity, no rebound, no guarding and no CVA tenderness.  Musculoskeletal: She exhibits no edema or tenderness.  Neurological: She is alert and oriented to person, place, and time.  Mental Status: Alert and oriented to person, place, and time. Attention and concentration normal. Speech clear. Recent memory is intac  Cranial Nerves  II Visual Fields: Intact to confrontation. Visual fields intact. III, IV, VI: Pupils equal and reactive to light and near. Full eye movement without nystagmus  V Facial Sensation: Normal. No weakness of masticatory muscles  VII: No facial weakness or asymmetry  VIII Auditory  Acuity: Grossly normal  IX/X: The uvula is midline; the palate elevates symmetrically  XI: Normal sternocleidomastoid and trapezius strength  XII: The tongue is midline. No atrophy or fasciculations.   Motor System: Muscle Strength: 5/5 and symmetric in the upper and lower extremities. No pronation or drift.  Muscle Tone: Tone and muscle bulk are normal in the upper and lower extremities.   Reflexes: DTRs: 2+ and symmetrical in all four extremities. Plantar responses are flexor bilaterally.  Coordination: Intact finger-to-nose, heel-to-shin, and rapid alternating movements. No tremor.  Sensation: Intact to light touch, and pinprick. Marland Kitchen   HINTS: Nystagmus: none Head impulse. abnormal Skew: normal   Skin: Skin is warm and dry. No rash noted. She is not diaphoretic. No erythema.  Psychiatric: She has a normal mood and affect.  Vitals reviewed.    ED Treatments / Results  Labs (all labs ordered are  listed, but only abnormal results are displayed) Labs Reviewed  COMPREHENSIVE METABOLIC PANEL - Abnormal; Notable for the following:       Result Value   Glucose, Bld 147 (*)    ALT 13 (*)    All other components within normal limits  URINALYSIS, ROUTINE W REFLEX MICROSCOPIC - Abnormal; Notable for the following:    APPearance CLOUDY (*)    Protein, ur 100 (*)    Squamous Epithelial / LPF 0-5 (*)    All other components within normal limits  CBC WITH DIFFERENTIAL/PLATELET  I-STAT CG4 LACTIC ACID, ED    EKG  EKG Interpretation None       Radiology No results found.  Procedures Procedures (including critical care time)  Medications Ordered in ED Medications  ondansetron (ZOFRAN-ODT) 4 MG disintegrating tablet (not administered)  ondansetron (ZOFRAN-ODT) disintegrating tablet 4 mg (4 mg Oral Given 09/04/16 1945)  meclizine (ANTIVERT) tablet 25 mg (25 mg Oral Given 09/04/16 2126)     Initial Impression / Assessment and Plan / ED Course  I have reviewed the triage vital  signs and the nursing notes.  Pertinent labs & imaging results that were available during my care of the patient were reviewed by me and considered in my medical decision making (see chart for details).     Vertiginous symptoms most consistent with peripheral etiology. HINTs exam reassuring. Patient provided with meclizine which resulted in significant improvement in her symptomatology. Low suspicion for CVA, or central process.  Abdominal cramping secondary to emesis. Currently patient's abdomen is benign. Labs closely reassuring.  The patient is safe for discharge with strict return precautions.   Final Clinical Impressions(s) / ED Diagnoses   Final diagnoses:  Peripheral vertigo involving right ear  Non-intractable vomiting with nausea, unspecified vomiting type  Abdominal cramping   Disposition: Discharge  Condition: Good  I have discussed the results, Dx and Tx plan with the patient who expressed understanding and agree(s) with the plan. Discharge instructions discussed at great length. The patient was given strict return precautions who verbalized understanding of the instructions. No further questions at time of discharge.    Discharge Medication List as of 09/04/2016 10:36 PM      Follow Up: Johny BlamerWilliam Harris, MD [address] [place] [phone]  Schedule an appointment as soon as possible for a visit  in 3-5 days, If symptoms do not improve or  worsen      Nira ConnPedro Eduardo Loleta Frommelt, MD 09/05/16 [phone]

## 2016-09-04 NOTE — ED Notes (Addendum)
Patient attempted to leave without being seen.  Seen ambulating to parking lot.  RN approached patient, who vomited under canopy out front of ED.  Patient accompanied back to lobby and provided with zofran.  Delay explained and comfort measures attempted.

## 2016-09-12 ENCOUNTER — Other Ambulatory Visit: Payer: Self-pay | Admitting: Family Medicine

## 2016-09-12 DIAGNOSIS — Z1231 Encounter for screening mammogram for malignant neoplasm of breast: Secondary | ICD-10-CM

## 2016-10-01 ENCOUNTER — Ambulatory Visit
Admission: RE | Admit: 2016-10-01 | Discharge: 2016-10-01 | Disposition: A | Discharge status: Home / Self Care | Payer: No Typology Code available for payment source | Source: Ambulatory Visit | Attending: Family Medicine | Admitting: Family Medicine

## 2016-10-01 DIAGNOSIS — Z1231 Encounter for screening mammogram for malignant neoplasm of breast: Secondary | ICD-10-CM

## 2019-04-27 ENCOUNTER — Other Ambulatory Visit: Payer: Self-pay | Admitting: Family Medicine

## 2019-04-27 DIAGNOSIS — Z1231 Encounter for screening mammogram for malignant neoplasm of breast: Secondary | ICD-10-CM

## 2019-06-16 ENCOUNTER — Other Ambulatory Visit: Payer: Self-pay

## 2019-06-16 ENCOUNTER — Ambulatory Visit
Admission: RE | Admit: 2019-06-16 | Discharge: 2019-06-16 | Disposition: A | Discharge status: Home / Self Care | Payer: PRIVATE HEALTH INSURANCE | Source: Ambulatory Visit | Attending: Family Medicine | Admitting: Family Medicine

## 2019-06-16 DIAGNOSIS — Z1231 Encounter for screening mammogram for malignant neoplasm of breast: Secondary | ICD-10-CM

## 2019-09-21 ENCOUNTER — Other Ambulatory Visit: Payer: Self-pay

## 2019-09-21 ENCOUNTER — Ambulatory Visit (INDEPENDENT_AMBULATORY_CARE_PROVIDER_SITE_OTHER): Payer: Self-pay

## 2019-09-21 ENCOUNTER — Ambulatory Visit (HOSPITAL_COMMUNITY)
Admission: EM | Admit: 2019-09-21 | Discharge: 2019-09-21 | Disposition: A | Discharge status: Home / Self Care | Payer: Self-pay | Attending: Family Medicine | Admitting: Family Medicine

## 2019-09-21 ENCOUNTER — Encounter (HOSPITAL_COMMUNITY): Payer: Self-pay

## 2019-09-21 DIAGNOSIS — M25461 Effusion, right knee: Secondary | ICD-10-CM

## 2019-09-21 DIAGNOSIS — M25561 Pain in right knee: Secondary | ICD-10-CM

## 2019-09-21 MED ORDER — METHOCARBAMOL 500 MG PO TABS: 500.0000 mg | ORAL_TABLET | Freq: Four times a day (QID) | ORAL | 0 refills | Status: DC | PRN

## 2019-09-21 MED ORDER — MELOXICAM 7.5 MG PO TABS: 7.5000 mg | ORAL_TABLET | Freq: Every day | ORAL | 0 refills | Status: DC

## 2019-09-21 MED ORDER — TRAMADOL HCL 50 MG PO TABS: 50.0000 mg | ORAL_TABLET | Freq: Four times a day (QID) | ORAL | 0 refills | Status: DC | PRN

## 2019-09-21 NOTE — ED Provider Notes (Signed)
[facility]    CSN: [id number] Arrival date & time: 09/21/19  1916      History   Chief Complaint Chief Complaint  Patient presents with  . Knee Swelling    HPI Lauren Rogers is a 64 y.o. female.   Presents with right knee pain and swelling for the last 2 days.  Patient states that she had gel injected at her orthopedics office last week on Wednesday.  Reports that this was her last injection of a 5 injection series.  Reports that she woke up Sunday morning with her knee swollen and painful.  Reports that she is taken ibuprofen for pain.  Reports that this is not helping her.  Denies known injury.  Denies fall.  Denies headache, cough, shortness of breath, sore throat, nausea, vomiting, diarrhea, rash, other symptoms.  Patient reports that she has been try to call orthopedics office, but she cannot get in touch with them and states that they are not in the office until Wednesday.  The history is provided by the patient.    Past Medical History:  Diagnosis Date  . Anxiety   . Depression     Patient Active Problem List   Diagnosis Date Noted  . Acute appendicitis 02/20/2015  . Chest pain 05/12/2014  . Tobacco abuse 05/12/2014  . Depression 05/12/2014  . Anxiety 05/12/2014    Past Surgical History:  Procedure Laterality Date  . ABDOMINAL HYSTERECTOMY  1998  . LAPAROSCOPIC APPENDECTOMY N/A 02/20/2015   Procedure: APPENDECTOMY LAPAROSCOPIC;  Surgeon: Almond Lint, MD;  Location: [facility] OR;  Service: General;  Laterality: N/A;    OB History    Gravida  4   Para  2   Term  2   Preterm      AB  2   Living  2     SAB  2   TAB      Ectopic      Multiple      Live Births               Home Medications    Prior to Admission medications   Medication Sig Start Date End Date Taking? Authorizing Provider  buPROPion (WELLBUTRIN XL) 300 MG 24 hr tablet Take 300 mg by mouth daily.    [provider]  BuPROPion HCl (WELLBUTRIN PO) Take 1  tablet by mouth daily.    [provider]  docusate sodium (COLACE) 100 MG capsule Take 1 capsule (100 mg total) by mouth 2 (two) times daily as needed for mild constipation. Patient not taking: Reported on 09/04/2016 02/22/15   Nonie Hoyer, PA-C  HYDROcodone-acetaminophen [facility]) 5-325 MG per tablet Take 1-2 tablets by mouth every 6 (six) hours as needed for moderate pain or severe pain. Patient not taking: Reported on 09/04/2016 02/22/15   Nonie Hoyer, PA-C  LISINOPRIL PO Take 1 tablet by mouth daily.    [provider]  meclizine (ANTIVERT) 25 MG tablet Take 1 tablet (25 mg total) by mouth 3 (three) times daily as needed for dizziness. 09/04/16   Nira Conn, MD  meloxicam (MOBIC) 7.5 MG tablet Take 1 tablet (7.5 mg total) by mouth daily. 09/21/19   Moshe Cipro, NP  methocarbamol (ROBAXIN) 500 MG tablet Take 1 tablet (500 mg total) by mouth every 6 (six) hours as needed for muscle spasms. 09/21/19   Moshe Cipro, NP  omeprazole (PRILOSEC) 40 MG capsule Take 1 capsule (40 mg total) by mouth daily. Patient not  taking: Reported on 02/20/2015 05/13/14   Calvert Cantor, MD  SERTRALINE HCL PO Take 1 tablet by mouth daily.    [provider]  traMADol (ULTRAM) 50 MG tablet Take 1 tablet (50 mg total) by mouth every 6 (six) hours as needed. 09/21/19   Moshe Cipro, NP    Family History Family History  Problem Relation Age of Onset  . Diabetes Father   . Cancer Father        prostate, colon    Social History Social History   Tobacco Use  . Smoking status: Current Every Day Smoker    Packs/day: 0.25    Years: 20.00    Pack years: 5.00    Types: Cigarettes  . Smokeless tobacco: Never Used  Substance Use Topics  . Alcohol use: Yes    Comment: occassionally  . Drug use: No     Allergies   Patient has no known allergies.   Review of Systems Review of Systems  Constitutional: Negative for chills, diaphoresis, fatigue and fever.   HENT: Negative for ear pain and sore throat.   Eyes: Negative for pain and visual disturbance.  Respiratory: Negative for cough and shortness of breath.   Cardiovascular: Negative for chest pain and palpitations.  Gastrointestinal: Negative for abdominal pain and vomiting.  Genitourinary: Negative for dysuria and hematuria.  Musculoskeletal: Positive for joint swelling. Negative for arthralgias and back pain.       Right knee.  Skin: Negative for color change and rash.  Neurological: Negative for seizures and syncope.  All other systems reviewed and are negative.    Physical Exam Triage Vital Signs ED Triage Vitals  Enc Vitals Group     BP      Pulse      Resp      Temp      Temp src      SpO2      Weight      Height      Head Circumference      Peak Flow      Pain Score      Pain Loc      Pain Edu?      Excl. in GC?    No data found.  Updated Vital Signs BP (!) 163/89 (BP Location: Right Arm)   Pulse 93   Temp 98.6 F (37 C) (Oral)   Resp 17   SpO2 97%   Visual Acuity Right Eye Distance:   Left Eye Distance:   Bilateral Distance:    Right Eye Near:   Left Eye Near:    Bilateral Near:     Physical Exam Vitals and nursing note reviewed.  Constitutional:      General: She is not in acute distress.    Appearance: She is well-developed and normal weight. She is not ill-appearing.  HENT:     Head: Normocephalic and atraumatic.     Nose: Nose normal.     Mouth/Throat:     Mouth: Mucous membranes are moist.     Pharynx: Oropharynx is clear.  Eyes:     Extraocular Movements: Extraocular movements intact.     Conjunctiva/sclera: Conjunctivae normal.     Pupils: Pupils are equal, round, and reactive to light.  Cardiovascular:     Rate and Rhythm: Normal rate and regular rhythm.     Heart sounds: Normal heart sounds. No murmur.  Pulmonary:     Effort: Pulmonary effort is normal. No respiratory distress.     Breath  sounds: Normal breath sounds.    Abdominal:     General: Abdomen is flat. There is no distension.     Palpations: Abdomen is soft. There is no mass.     Tenderness: There is no abdominal tenderness. There is no guarding or rebound.     Hernia: No hernia is present.  Musculoskeletal:        General: Swelling present. Normal range of motion.     Cervical back: Normal range of motion and neck supple.     Comments: Right knee.  Skin:    General: Skin is warm and dry.     Capillary Refill: Capillary refill takes less than 2 seconds.  Neurological:     General: No focal deficit present.     Mental Status: She is alert and oriented to person, place, and time.  Psychiatric:        Mood and Affect: Mood normal.        Behavior: Behavior normal.        Thought Content: Thought content normal.      UC Treatments / Results  Labs (all labs ordered are listed, but only abnormal results are displayed) Labs Reviewed - No data to display  EKG   Radiology DG Knee Complete 4 Views Right  Result Date: 09/21/2019 CLINICAL DATA:  Knee pain with swelling EXAM: RIGHT KNEE - COMPLETE 4+ VIEW COMPARISON:  None. FINDINGS: No fracture or malalignment. Minimal spurring and degenerative change at the medial joint and patellofemoral joint. Large knee effusion is present. IMPRESSION: 1. No acute osseous abnormality 2. Large knee effusion Electronically Signed   By: Jasmine Pang M.D.   On: 09/21/2019 20:02    Procedures Procedures (including critical care time)  Medications Ordered in UC Medications - No data to display  Initial Impression / Assessment and Plan / UC Course  I have reviewed the triage vital signs and the nursing notes.  Pertinent labs & imaging results that were available during my care of the patient were reviewed by me and considered in my medical decision making (see chart for details).     Presents today with 2-day history of right knee pain and swelling.  Also has history of receiving gel injections for the  last 5 weeks.  X-ray today shows large joint effusion, no concern at this point for osteomyelitis.  Instructed patient to go to orthopedics to have her knee drained if she wishes.  Discussed that we do not do that procedure here.  Prescribed meloxicam 7.5 mg once daily for inflammation, also prescribed Robaxin 500 mg every 6 hours as needed for muscle spasms, and tramadol 50 mg every 6 hours as needed for pain. Final Clinical Impressions(s) / UC Diagnoses   Final diagnoses:  Acute pain of right knee     Discharge Instructions     Rest and elevate your leg.  Apply ice packs 2-3 times a day for up to 20 minutes each.    Follow up with your primary care provider or an orthopedist if you symptoms continue or worsen;  Or if you develop new symptoms, such as numbness, tingling, or weakness.    See ortho.    ED Prescriptions    Medication Sig Dispense Auth. Provider   meloxicam (MOBIC) 7.5 MG tablet Take 1 tablet (7.5 mg total) by mouth daily. 15 tablet Moshe Cipro, NP   methocarbamol (ROBAXIN) 500 MG tablet  (Status: Discontinued) Take 1 tablet (500 mg total) by mouth every 6 (six) hours as needed  for muscle spasms. 30 tablet Faustino Congress, NP   traMADol (ULTRAM) 50 MG tablet  (Status: Discontinued) Take 1 tablet (50 mg total) by mouth every 6 (six) hours as needed. 15 tablet Faustino Congress, NP   methocarbamol (ROBAXIN) 500 MG tablet Take 1 tablet (500 mg total) by mouth every 6 (six) hours as needed for muscle spasms. 30 tablet Faustino Congress, NP   traMADol (ULTRAM) 50 MG tablet Take 1 tablet (50 mg total) by mouth every 6 (six) hours as needed. 15 tablet Faustino Congress, NP     I have reviewed the PDMP during this encounter.   Faustino Congress, NP 09/23/19 [phone]

## 2019-09-21 NOTE — ED Triage Notes (Signed)
Pt presents with right knee pain & swelling since Saturday; pt states she just had a procedure Wednesday to insert a gel in her knee cap at a orthopedic office, pt states she has been unable to reach them.

## 2019-09-21 NOTE — Discharge Instructions (Signed)
Rest and elevate your leg.  Apply ice packs 2-3 times a day for up to 20 minutes each.    Follow up with your primary care provider or an orthopedist if you symptoms continue or worsen;  Or if you develop new symptoms, such as numbness, tingling, or weakness.    See ortho.

## 2019-09-25 ENCOUNTER — Other Ambulatory Visit: Payer: Self-pay

## 2019-09-25 ENCOUNTER — Emergency Department (HOSPITAL_COMMUNITY)
Admission: EM | Admit: 2019-09-25 | Discharge: 2019-09-25 | Disposition: A | Discharge status: Home / Self Care | Payer: PRIVATE HEALTH INSURANCE | Attending: Emergency Medicine | Admitting: Emergency Medicine

## 2019-09-25 ENCOUNTER — Emergency Department (HOSPITAL_COMMUNITY): Payer: PRIVATE HEALTH INSURANCE

## 2019-09-25 ENCOUNTER — Encounter (HOSPITAL_COMMUNITY): Payer: Self-pay

## 2019-09-25 DIAGNOSIS — M25561 Pain in right knee: Secondary | ICD-10-CM | POA: Insufficient documentation

## 2019-09-25 DIAGNOSIS — Z79899 Other long term (current) drug therapy: Secondary | ICD-10-CM | POA: Insufficient documentation

## 2019-09-25 DIAGNOSIS — F17210 Nicotine dependence, cigarettes, uncomplicated: Secondary | ICD-10-CM | POA: Diagnosis not present

## 2019-09-25 LAB — CBC WITH DIFFERENTIAL/PLATELET
Abs Immature Granulocytes: 0.04 10*3/uL (ref 0.00–0.07)
Basophils Absolute: 0.1 10*3/uL (ref 0.0–0.1)
Basophils Relative: 1 %
Eosinophils Absolute: 0.2 10*3/uL (ref 0.0–0.5)
Eosinophils Relative: 2 %
HCT: 42.5 % (ref 36.0–46.0)
Hemoglobin: 13.5 g/dL (ref 12.0–15.0)
Immature Granulocytes: 1 %
Lymphocytes Relative: 45 %
Lymphs Abs: 3.4 10*3/uL (ref 0.7–4.0)
MCH: 26 pg (ref 26.0–34.0)
MCHC: 31.8 g/dL (ref 30.0–36.0)
MCV: 81.9 fL (ref 80.0–100.0)
Monocytes Absolute: 0.7 10*3/uL (ref 0.1–1.0)
Monocytes Relative: 9 %
Neutro Abs: 3.1 10*3/uL (ref 1.7–7.7)
Neutrophils Relative %: 42 %
Platelets: 300 10*3/uL (ref 150–400)
RBC: 5.19 MIL/uL — ABNORMAL HIGH (ref 3.87–5.11)
RDW: 14.7 % (ref 11.5–15.5)
WBC: 7.5 10*3/uL (ref 4.0–10.5)
nRBC: 0 % (ref 0.0–0.2)

## 2019-09-25 LAB — BASIC METABOLIC PANEL
Anion gap: 12 (ref 5–15)
BUN: 11 mg/dL (ref 8–23)
CO2: 28 mmol/L (ref 22–32)
Calcium: 9.8 mg/dL (ref 8.9–10.3)
Chloride: 103 mmol/L (ref 98–111)
Creatinine, Ser: 0.8 mg/dL (ref 0.44–1.00)
GFR calc Af Amer: >60 mL/min (ref >60)
GFR calc non Af Amer: >60 mL/min (ref >60)
Glucose, Bld: 99 mg/dL (ref 70–99)
Potassium: 3.8 mmol/L (ref 3.5–5.1)
Sodium: 143 mmol/L (ref 135–145)

## 2019-09-25 LAB — SYNOVIAL CELL COUNT + DIFF, W/ CRYSTALS
Crystals, Fluid: NONE SEEN
Eosinophils-Synovial: 0 % (ref 0–1)
Lymphocytes-Synovial Fld: 9 % (ref 0–20)
Monocyte-Macrophage-Synovial Fluid: 21 % — ABNORMAL LOW (ref 50–90)
Neutrophil, Synovial: 70 % — ABNORMAL HIGH (ref 0–25)
WBC, Synovial: 3800 /mm3 — ABNORMAL HIGH (ref 0–200)

## 2019-09-25 LAB — GRAM STAIN

## 2019-09-25 LAB — SEDIMENTATION RATE: Sed Rate: 48 mm/hr — ABNORMAL HIGH (ref 0–22)

## 2019-09-25 LAB — C-REACTIVE PROTEIN: CRP: 5.4 mg/dL — ABNORMAL HIGH (ref <1.0)

## 2019-09-25 MED ORDER — LIDOCAINE HCL (PF) 1 % IJ SOLN
30.0000 mL | Freq: Once | INTRAMUSCULAR | Status: AC
  Administered 2019-09-25: 30 mL
  Filled 2019-09-25: qty 30

## 2019-09-25 MED ORDER — HYDROCODONE-ACETAMINOPHEN 5-325 MG PO TABS
1.0000 | ORAL_TABLET | Freq: Once | ORAL | Status: AC
  Administered 2019-09-25: 1 via ORAL
  Filled 2019-09-25: qty 1

## 2019-09-25 NOTE — Discharge Instructions (Signed)
Will be called if any bacteria grows on your Gram stain.

## 2019-09-25 NOTE — ED Triage Notes (Signed)
Pt reports right knee pain and swelling, pt had flexogenix done on that knee 1 week ago. Pt unable to get in touch with their office today to follow up.

## 2019-09-25 NOTE — ED Provider Notes (Signed)
[facility] EMERGENCY DEPARTMENT Provider Note   CSN: [id number] Arrival date & time: 09/25/19  1124    History Chief Complaint  Patient presents with  . Knee Pain    Lauren Rogers is a 64 y.o. female with past medical history significant for tobacco use who presents for evaluation of knee pain.  She has been followed by [facility].  Has been getting weekly injections to her knee.  On Sunday developed right knee pain and swelling.  Has been taking home ibuprofen with little relief.  Has not been able to reach out to orthopedics.  Seen in urgent care had x-ray done which showed large effusion without fracture or dislocation.  She has been unable to reach out to orthopedics to be evaluated.  Pain worse with movement.  Denies recent falls or injury.  No fever, chills, nausea, vomiting, pain with flexion or extension, redness or warmth.  No bony pain.  Denies additional aggrivating or relieving factors.  History obtained from patient past medical record.  No interpreter used.    HPI     Past Medical History:  Diagnosis Date  . Anxiety   . Depression     Patient Active Problem List   Diagnosis Date Noted  . Acute appendicitis 02/20/2015  . Chest pain 05/12/2014  . Tobacco abuse 05/12/2014  . Depression 05/12/2014  . Anxiety 05/12/2014    Past Surgical History:  Procedure Laterality Date  . ABDOMINAL HYSTERECTOMY  1998  . LAPAROSCOPIC APPENDECTOMY N/A 02/20/2015   Procedure: APPENDECTOMY LAPAROSCOPIC;  Surgeon: Almond Lint, MD;  Location: [facility] OR;  Service: General;  Laterality: N/A;     OB History    Gravida  4   Para  2   Term  2   Preterm      AB  2   Living  2     SAB  2   TAB      Ectopic      Multiple      Live Births              Family History  Problem Relation Age of Onset  . Diabetes Father   . Cancer Father        prostate, colon    Social History   Tobacco Use  . Smoking status: Current Every Day Smoker   Packs/day: 0.25    Years: 20.00    Pack years: 5.00    Types: Cigarettes  . Smokeless tobacco: Never Used  Substance Use Topics  . Alcohol use: Yes    Comment: occassionally  . Drug use: No    Home Medications Prior to Admission medications   Medication Sig Start Date End Date Taking? Authorizing Provider  buPROPion (WELLBUTRIN XL) 300 MG 24 hr tablet Take 300 mg by mouth daily.    [provider]  BuPROPion HCl (WELLBUTRIN PO) Take 1 tablet by mouth daily.    [provider]  docusate sodium (COLACE) 100 MG capsule Take 1 capsule (100 mg total) by mouth 2 (two) times daily as needed for mild constipation. Patient not taking: Reported on 09/04/2016 02/22/15   Nonie Hoyer, PA-C  HYDROcodone-acetaminophen [facility]) 5-325 MG per tablet Take 1-2 tablets by mouth every 6 (six) hours as needed for moderate pain or severe pain. Patient not taking: Reported on 09/04/2016 02/22/15   Nonie Hoyer, PA-C  LISINOPRIL PO Take 1 tablet by mouth daily.    [provider]  meclizine (ANTIVERT) 25  MG tablet Take 1 tablet (25 mg total) by mouth 3 (three) times daily as needed for dizziness. 09/04/16   Fatima Blank, MD  meloxicam (MOBIC) 7.5 MG tablet Take 1 tablet (7.5 mg total) by mouth daily. 09/21/19   Faustino Congress, NP  methocarbamol (ROBAXIN) 500 MG tablet Take 1 tablet (500 mg total) by mouth every 6 (six) hours as needed for muscle spasms. 09/21/19   Faustino Congress, NP  omeprazole (PRILOSEC) 40 MG capsule Take 1 capsule (40 mg total) by mouth daily. Patient not taking: Reported on 02/20/2015 05/13/14   Debbe Odea, MD  SERTRALINE HCL PO Take 1 tablet by mouth daily.    [provider]  traMADol (ULTRAM) 50 MG tablet Take 1 tablet (50 mg total) by mouth every 6 (six) hours as needed. 09/21/19   Faustino Congress, NP    Allergies    Patient has no known allergies.  Review of Systems   Review of Systems  Constitutional: Negative.   HENT:  Negative.   Respiratory: Negative.   Cardiovascular: Negative.   Gastrointestinal: Negative.   Genitourinary: Negative.   Musculoskeletal:       Right knee pain and swelling  Skin: Negative.   Neurological: Negative.   All other systems reviewed and are negative.   Physical Exam Updated Vital Signs BP (!) 151/93   Pulse 74   Temp 98.4 F (36.9 C) (Oral)   Resp 18   Ht 5\' 7"  (1.702 m)   Wt 90.7 kg   SpO2 90%   BMI 31.32 kg/m   Physical Exam Vitals and nursing note reviewed.  Constitutional:      General: She is not in acute distress.    Appearance: She is well-developed. She is not ill-appearing, toxic-appearing or diaphoretic.  HENT:     Head: Normocephalic and atraumatic.     Nose: Nose normal.     Mouth/Throat:     Mouth: Mucous membranes are moist.     Pharynx: Oropharynx is clear.  Eyes:     Pupils: Pupils are equal, round, and reactive to light.  Cardiovascular:     Rate and Rhythm: Normal rate.     Pulses: Normal pulses.          Dorsalis pedis pulses are 2+ on the right side and 2+ on the left side.       Posterior tibial pulses are 2+ on the right side and 2+ on the left side.     Heart sounds: Normal heart sounds.  Pulmonary:     Effort: No respiratory distress.     Breath sounds: Normal breath sounds.  Abdominal:     General: Bowel sounds are normal. There is no distension.  Musculoskeletal:        General: Normal range of motion.     Cervical back: Normal range of motion.     Comments: Large right joint effusion to knee.  Able to flex and extend.  Possibly has some minimal overlying warmth however no erythema.  Compartments soft.  No bony tenderness.  Feet:     Right foot:     Skin integrity: Skin integrity normal.     Left foot:     Skin integrity: Skin integrity normal.  Skin:    General: Skin is warm and dry.     Capillary Refill: Capillary refill takes less than 2 seconds.     Comments: Brisk cap refill.  No erythema  Neurological:      Mental Status: She is alert.  Cranial Nerves: Cranial nerves are intact.     Sensory: Sensation is intact.     Motor: Motor function is intact.     Coordination: Coordination is intact.     Gait: Gait is intact.     Comments: Ambulatory without difficulty. Intact sensation     ED Results / Procedures / Treatments   Labs (all labs ordered are listed, but only abnormal results are displayed) Labs Reviewed  SYNOVIAL CELL COUNT + DIFF, W/ CRYSTALS - Abnormal; Notable for the following components:      Result Value   Color, Synovial YELLOW (*)    Appearance-Synovial HAZY (*)    WBC, Synovial 3,800 (*)    Neutrophil, Synovial 70 (*)    Monocyte-Macrophage-Synovial Fluid 21 (*)    All other components within normal limits  CBC WITH DIFFERENTIAL/PLATELET - Abnormal; Notable for the following components:   RBC 5.19 (*)    All other components within normal limits  C-REACTIVE PROTEIN - Abnormal; Notable for the following components:   CRP 5.4 (*)    All other components within normal limits  SEDIMENTATION RATE - Abnormal; Notable for the following components:   Sed Rate 48 (*)    All other components within normal limits  GRAM STAIN  CULTURE, BODY FLUID-BOTTLE  BASIC METABOLIC PANEL  GLUCOSE, BODY FLUID OTHER  PROTEIN, BODY FLUID (OTHER)  URIC ACID, BODY FLUID    EKG None  Radiology No results found.  Procedures .Joint Aspiration/Arthrocentesis  Date/Time: 09/25/2019 2:19 PM Performed by: Linwood Dibbles, PA-C Authorized by: Linwood Dibbles, PA-C   Consent:    Consent obtained:  Verbal   Consent given by:  Patient   Risks discussed:  Bleeding, infection, pain, poor cosmetic result, incomplete drainage and nerve damage   Alternatives discussed:  No treatment, delayed treatment, alternative treatment, observation and referral Location:    Location:  Knee   Knee:  R knee Anesthesia (see MAR for exact dosages):    Anesthesia method:  Local  infiltration   Local anesthetic:  Lidocaine 1% w/o epi Procedure details:    Preparation: Patient was prepped and draped in usual sterile fashion     Needle gauge:  20 G   Ultrasound guidance: no     Approach:  Lateral   Aspirate amount:  90cc   Aspirate characteristics:  Yellow   Steroid injected: no     Specimen collected: yes   Post-procedure details:    Dressing:  Adhesive bandage   Patient tolerance of procedure:  Tolerated well, no immediate complications   (including critical care time)  Medications Ordered in ED Medications  lidocaine (PF) (XYLOCAINE) 1 % injection 30 mL (30 mLs Other Given 09/25/19 1400)  HYDROcodone-acetaminophen (NORCO/VICODIN) 5-325 MG per tablet 1 tablet (1 tablet Oral Given 09/25/19 1240)    ED Course  I have reviewed the triage vital signs and the nursing notes.  Pertinent labs & imaging results that were available during my care of the patient were reviewed by me and considered in my medical decision making (see chart for details).  64 year old female presents for evaluation of right knee pain and swelling.  Seen by [facility] for she was having injections to her knee.  Symptoms began on Sunday.  Seen in urgent care where had x-ray performed which did not show fracture dislocation however with large joint effusion.  She does have large effusion on exam.  Has full range of motion.  Neurovascularly intact.  There is some overlying warmth however  no erythema.  Patient declines additional imaging at this time which I feel is reasonable given imaging 3 days ago.  Shared decision making for labs and arthrocentesis.  She agrees to proceed.  Labs and imaging personally reviewed and interpreted  See procedure note, 90 cc yellow synovial fluid removed from right knee.  Patient tolerated well.  Sent for labs CBC without leukocytosis Sed rate 48 CRP 5.4 Metabolic panel without electrolyte, renal or liver normality  Synovial cell count not consistent with  septic joint.  Reviewed with attending, Dr. Rush Landmark  Patient requesting DC home.  She is pending uric acid and Gram stain.  Will DC home with close follow-up with orthopedics.   The patient has been appropriately medically screened and/or stabilized in the ED. I have low suspicion for any other emergent medical condition which would require further screening, evaluation or treatment in the ED or require inpatient management.  Patient is hemodynamically stable and in no acute distress.  Patient able to ambulate in department prior to ED.  Evaluation does not show acute pathology that would require ongoing or additional emergent interventions while in the emergency department or further inpatient treatment.  I have discussed the diagnosis with the patient and answered all questions.  Pain is been managed while in the emergency department and patient has no further complaints prior to discharge.  Patient is comfortable with plan discussed in room and is stable for discharge at this time.  I have discussed strict return precautions for returning to the emergency department.  Patient was encouraged to follow-up with PCP/specialist refer to at discharge.    MDM Rules/Calculators/A&P                      Final Clinical Impression(s) / ED Diagnoses Final diagnoses:  Acute pain of right knee    Rx / DC Orders ED Discharge Orders    None       Shironda Kain A, PA-C 09/25/19 1657    Arby Barrette, MD 10/06/19 1531

## 2019-09-25 NOTE — ED Notes (Signed)
Pt transported to Xray. 

## 2019-09-26 LAB — URIC ACID, BODY FLUID: Uric Acid Body Fluid: 3.1 mg/dL

## 2019-09-26 LAB — GLUCOSE, BODY FLUID OTHER: Glucose, Body Fluid Other: 60 mg/dL

## 2019-09-26 LAB — PROTEIN, BODY FLUID (OTHER): Total Protein, Body Fluid Other: 5.2 g/dL

## 2019-09-30 LAB — CULTURE, BODY FLUID W GRAM STAIN -BOTTLE: Culture: NO GROWTH

## 2020-07-25 ENCOUNTER — Other Ambulatory Visit: Payer: Self-pay | Admitting: Family Medicine

## 2020-07-25 DIAGNOSIS — Z1231 Encounter for screening mammogram for malignant neoplasm of breast: Secondary | ICD-10-CM

## 2020-09-07 ENCOUNTER — Ambulatory Visit
Admission: RE | Admit: 2020-09-07 | Discharge: 2020-09-07 | Disposition: A | Discharge status: Home / Self Care | Payer: PRIVATE HEALTH INSURANCE | Source: Ambulatory Visit | Attending: Family Medicine | Admitting: Family Medicine

## 2020-09-07 ENCOUNTER — Other Ambulatory Visit: Payer: Self-pay

## 2020-09-07 DIAGNOSIS — Z1231 Encounter for screening mammogram for malignant neoplasm of breast: Secondary | ICD-10-CM

## 2021-03-29 IMAGING — MG DIGITAL SCREENING BILAT W/ CAD
4 series · 4 of 4 positions shown · non-contrast
Comparison: Previous exam(s).

CLINICAL DATA: Screening.

EXAM:
DIGITAL SCREENING BILATERAL MAMMOGRAM WITH CAD

[R MLO]
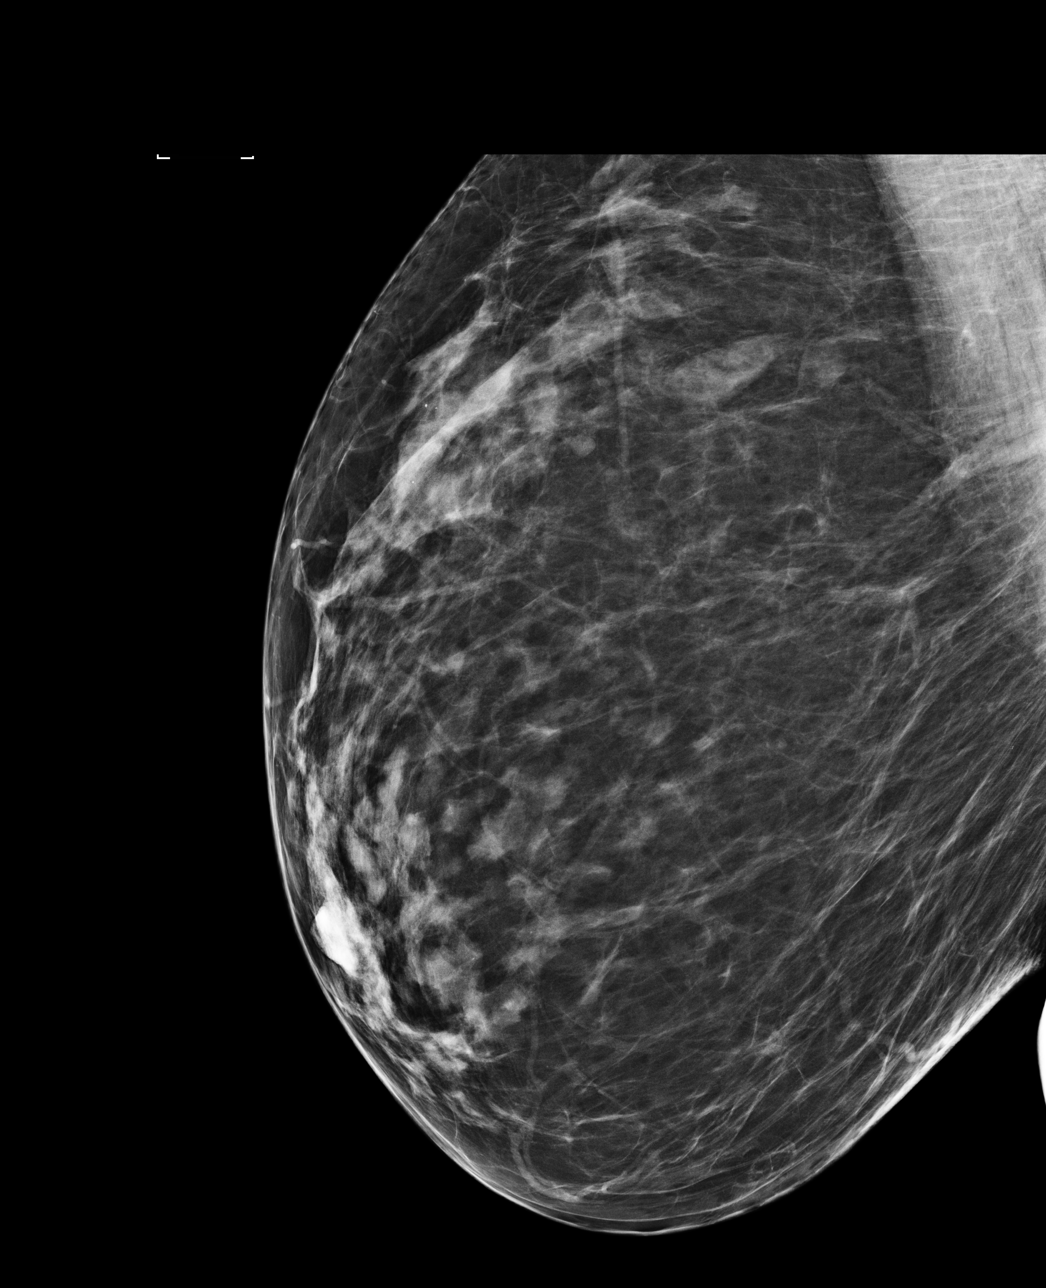

[R CC]
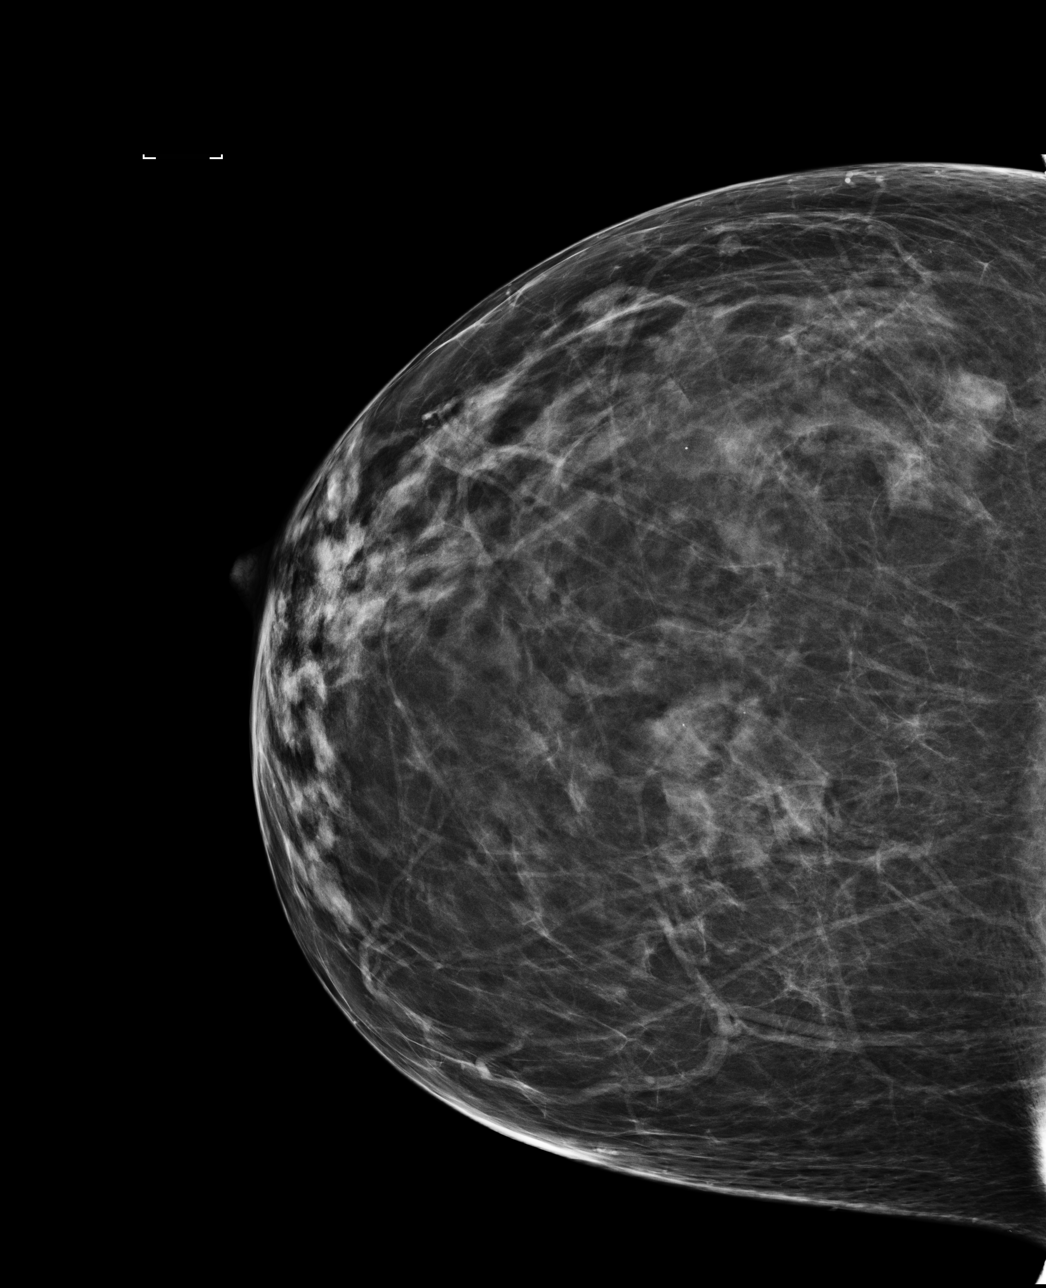

[L CC]
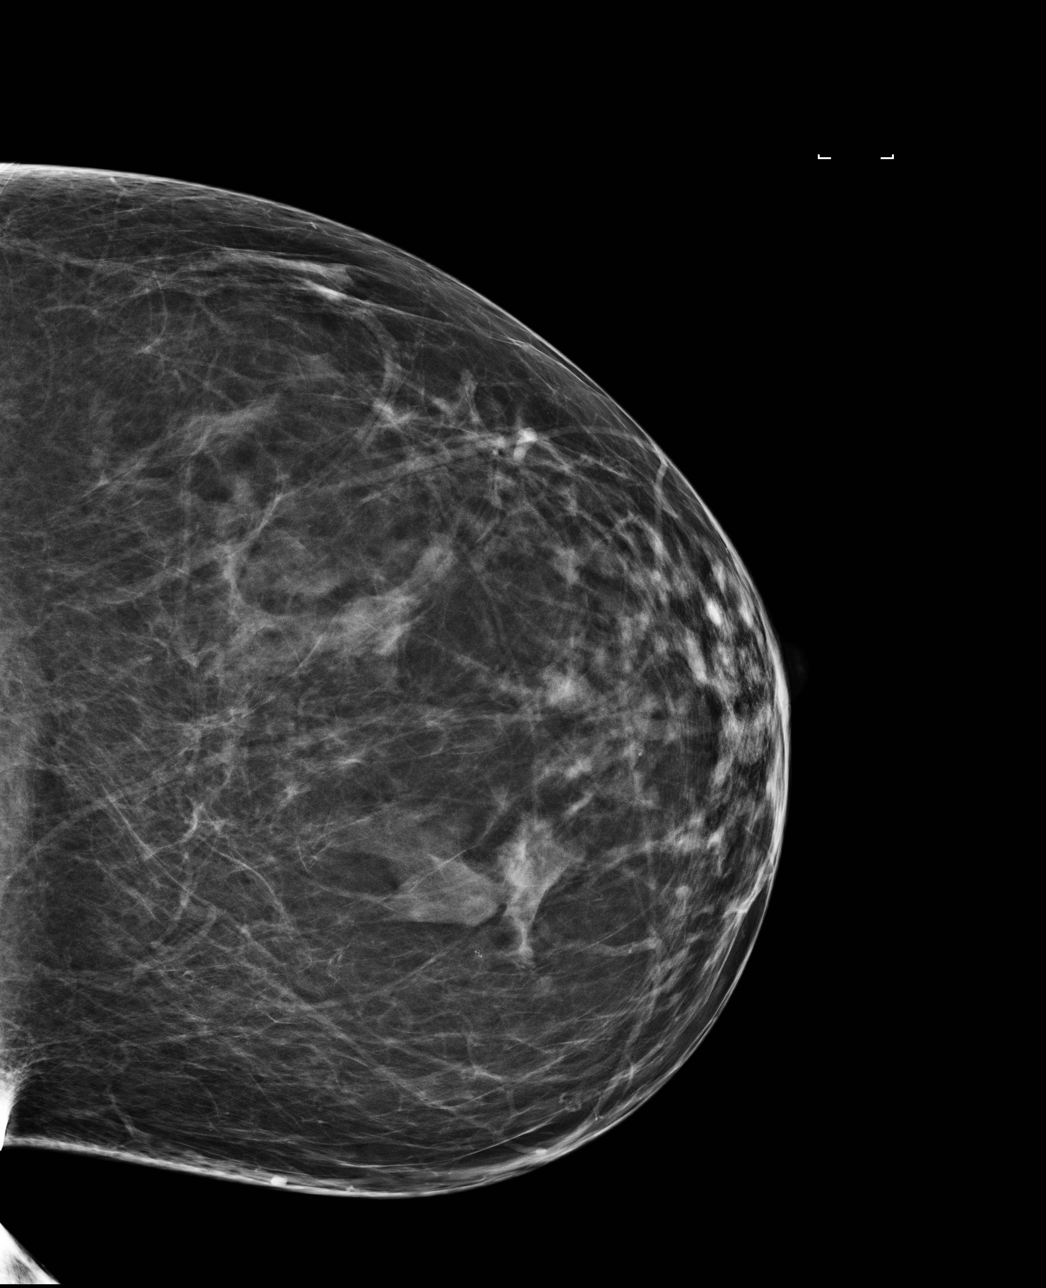

[L MLO]
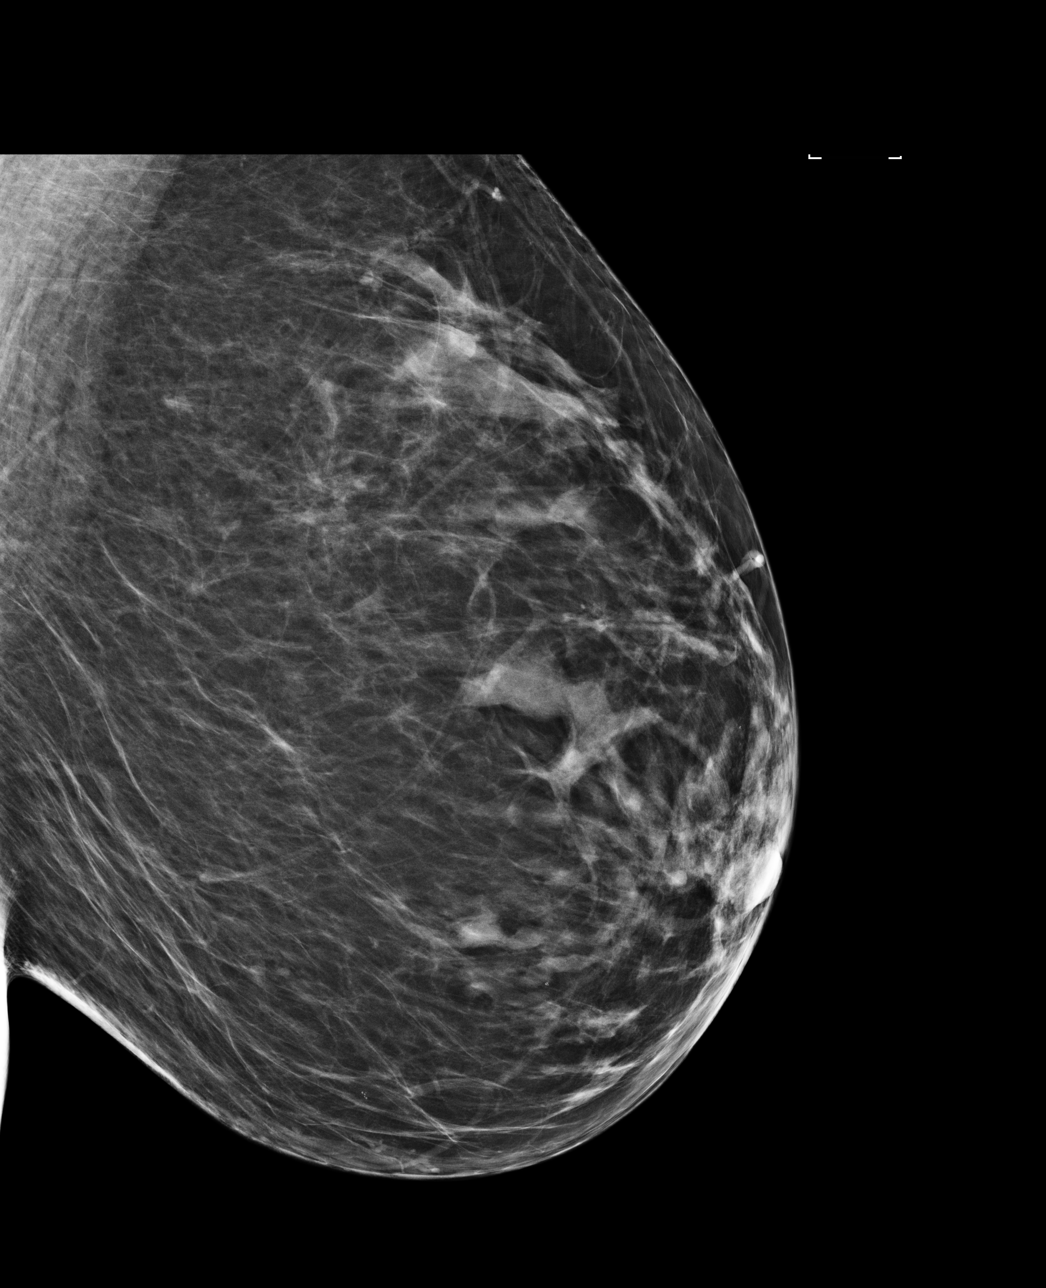

[4 of 4 positions shown; findings below may reference images not displayed]

ACR Breast Density Category b: There are scattered areas of
fibroglandular density.
FINDINGS: There are no findings suspicious for malignancy. Images were
processed with CAD.
IMPRESSION: No mammographic evidence of malignancy. A result letter of this
screening mammogram will be mailed directly to the patient.

RECOMMENDATION:
Screening mammogram in one year. (Code:AS-G-LCT)

BI-RADS CATEGORY  1: Negative.

## 2021-09-20 ENCOUNTER — Other Ambulatory Visit: Payer: Self-pay | Admitting: Family Medicine

## 2021-09-20 DIAGNOSIS — Z1231 Encounter for screening mammogram for malignant neoplasm of breast: Secondary | ICD-10-CM

## 2021-10-02 ENCOUNTER — Ambulatory Visit
Admission: RE | Admit: 2021-10-02 | Discharge: 2021-10-02 | Disposition: A | Discharge status: Home / Self Care | Payer: Medicare Other | Source: Ambulatory Visit | Attending: Family Medicine | Admitting: Family Medicine

## 2021-10-02 DIAGNOSIS — Z1231 Encounter for screening mammogram for malignant neoplasm of breast: Secondary | ICD-10-CM

## 2021-11-30 ENCOUNTER — Encounter (HOSPITAL_COMMUNITY): Payer: Self-pay | Admitting: Emergency Medicine

## 2021-11-30 ENCOUNTER — Ambulatory Visit (HOSPITAL_COMMUNITY)
Admission: EM | Admit: 2021-11-30 | Discharge: 2021-11-30 | Disposition: A | Discharge status: Home / Self Care | Payer: Medicare Other | Attending: Family Medicine | Admitting: Family Medicine

## 2021-11-30 DIAGNOSIS — H9202 Otalgia, left ear: Secondary | ICD-10-CM | POA: Diagnosis not present

## 2021-11-30 DIAGNOSIS — H8110 Benign paroxysmal vertigo, unspecified ear: Secondary | ICD-10-CM

## 2021-11-30 MED ORDER — MECLIZINE HCL 25 MG PO TABS: 25.0000 mg | ORAL_TABLET | Freq: Three times a day (TID) | ORAL | 0 refills | Status: AC | PRN

## 2021-11-30 NOTE — ED Triage Notes (Signed)
Pt is present today with dizziness, HA, and bilateral ear fullness/ pain. Pt sx started one week ago

## 2021-12-05 NOTE — ED Provider Notes (Signed)
[facility]   [id number] 11/30/21 Arrival Time: 1719  ASSESSMENT & PLAN:  1. Otalgia of left ear   2. Benign paroxysmal positional vertigo, unspecified laterality    Normal neurologic exam. No suspicion for ICH or SAH. No indication for urgent neurodiagnostic imaging at this time. Discussed. Trail of: Meds ordered this encounter  Medications   meclizine (ANTIVERT) 25 MG tablet    Sig: Take 1 tablet (25 mg total) by mouth 3 (three) times daily as needed for dizziness.    Dispense:  30 tablet    Refill:  0    Reassured that these symptoms do not appear to represent a serious or threatening condition. This is generally a self-limited temporary but uncomfortable situation. Rest, avoid potentially dangerous activities (such as driving or working with machinery or at heights). Use OTC Meclizine prn. Will proceed to the ED if he develops other symptoms such as alterations of speech, swallowing, vision, motor/sensory systems, or if dizziness worsens.  Reviewed expectations re: course of current medical issues. Questions answered. Outlined signs and symptoms indicating need for more acute intervention. Patient verbalized understanding. After Visit Summary given.   SUBJECTIVE:  Lauren Rogers is a 66 y.o. female who presents today reporting dizziness described as vertigo, occas mild HA, and bilateral ear "fullness". Gradual onset; 1 week ago. Comes and goes. Experiencing symptoms daily. Aggravating factors: head movements, bending, and walking. Denies memory problems, paresthesia, seizures, speech problems, tremors, and weakness as well as tinnitus. Recent infections: none. Head trauma: denied. Noise exposure: no occupational exposure. No associated SOB, CP, or palpatations reported. Recent travel: none. Reports normal bowel/bladder habits. Previous workup/treatments: none. No tx PTA.  Social History   Substance and Sexual Activity  Alcohol Use Yes   Comment: occassionally    Social History   Tobacco Use  Smoking Status Every Day   Packs/day: 0.25   Years: 20.00   Total pack years: 5.00   Types: Cigarettes  Smokeless Tobacco Never   Denies recreational drug use.   OBJECTIVE:  Vitals:   11/30/21 1732  BP: (!) 134/91  Pulse: 80  Resp: 18  Temp: 98.3 F (36.8 C)  SpO2: 97%    General appearance: alert; no distress Eyes: PERRLA; EOMI; conjunctiva normal HENT: normocephalic; atraumatic; mild bilateral serous otitis; nasal mucosa normal; oral mucosa normal Neck: supple with FROM Lungs: clear to auscultation bilaterally; unlabored Heart: regular Extremities: no cyanosis or edema; symmetrical with no gross deformities Skin: warm and dry Neurologic: normal gait; DTR's normal and symmetric; CN 2-12 grossly intact; rapid changes in position during the exam do precipitate brief dizziness Psychological: alert and cooperative; normal mood and affect  No results found.  No Known Allergies  Past Medical History:  Diagnosis Date   Anxiety    Depression    Social History   Socioeconomic History   Marital status: Divorced    Spouse name: Not on file   Number of children: Not on file   Years of education: Not on file   Highest education level: Not on file  Occupational History   Not on file  Tobacco Use   Smoking status: Every Day    Packs/day: 0.25    Years: 20.00    Total pack years: 5.00    Types: Cigarettes   Smokeless tobacco: Never  Substance and Sexual Activity   Alcohol use: Yes    Comment: occassionally   Drug use: No   Sexual activity: Not Currently    Birth control/protection: Surgical  Other  Topics Concern   Not on file  Social History Narrative   Not on file   Social Determinants of Health   Financial Resource Strain: Not on file  Food Insecurity: Not on file  Transportation Needs: Not on file  Physical Activity: Not on file  Stress: Not on file  Social Connections: Not on file  Intimate Partner Violence: Not on  file   Family History  Problem Relation Age of Onset   Diabetes Father    Cancer Father        prostate, colon   Past Surgical History:  Procedure Laterality Date   ABDOMINAL HYSTERECTOMY  1998   LAPAROSCOPIC APPENDECTOMY N/A 02/20/2015   Procedure: APPENDECTOMY LAPAROSCOPIC;  Surgeon: Almond Lint, MD;  Location: [facility] OR;  Service: General;  Laterality: Vertis Kelch, MD 12/05/21 1136

## 2022-04-30 ENCOUNTER — Other Ambulatory Visit: Payer: Self-pay | Admitting: Family Medicine

## 2022-04-30 DIAGNOSIS — Z13820 Encounter for screening for osteoporosis: Secondary | ICD-10-CM

## 2022-07-05 ENCOUNTER — Telehealth: Payer: Self-pay

## 2022-07-05 NOTE — Telephone Encounter (Signed)
Spoke with patient regarding LCS.   Patient reports low pack years for smoking history.  Age 67 years that began smoking about age 84 or 67 years of age.  Never smoked more than one quarter pack per day, usually 2-3 cigarettes a day. This will well under the 20 pack years requirement for LCS LDCT.  Advised patient she is not eligible for LCS but we will notify referring provider to review and recommend possible other options such as CT chest wo, as patient is very interested in having a CT chest completed.  Faxed update to Almedia Balls, NP.

## 2022-10-23 ENCOUNTER — Inpatient Hospital Stay: Admission: RE | Admit: 2022-10-23 | Payer: Medicare Other | Source: Ambulatory Visit

## 2023-06-05 ENCOUNTER — Encounter: Payer: Self-pay | Admitting: Family Medicine

## 2023-06-05 ENCOUNTER — Other Ambulatory Visit: Payer: Self-pay | Admitting: Family Medicine

## 2023-06-05 DIAGNOSIS — Z1231 Encounter for screening mammogram for malignant neoplasm of breast: Secondary | ICD-10-CM

## 2023-06-20 DIAGNOSIS — F17210 Nicotine dependence, cigarettes, uncomplicated: Secondary | ICD-10-CM | POA: Diagnosis not present

## 2023-06-20 DIAGNOSIS — M542 Cervicalgia: Secondary | ICD-10-CM | POA: Diagnosis not present

## 2023-07-01 ENCOUNTER — Ambulatory Visit
Admission: RE | Admit: 2023-07-01 | Discharge: 2023-07-01 | Disposition: A | Discharge status: Home / Self Care | Payer: 59 | Source: Ambulatory Visit | Attending: Family Medicine | Admitting: Family Medicine

## 2023-07-01 DIAGNOSIS — Z1231 Encounter for screening mammogram for malignant neoplasm of breast: Secondary | ICD-10-CM

## 2023-09-13 DIAGNOSIS — F17210 Nicotine dependence, cigarettes, uncomplicated: Secondary | ICD-10-CM | POA: Diagnosis not present

## 2023-09-13 DIAGNOSIS — E1165 Type 2 diabetes mellitus with hyperglycemia: Secondary | ICD-10-CM | POA: Diagnosis not present

## 2023-12-20 DIAGNOSIS — Z23 Encounter for immunization: Secondary | ICD-10-CM | POA: Diagnosis not present

## 2023-12-20 DIAGNOSIS — I10 Essential (primary) hypertension: Secondary | ICD-10-CM | POA: Diagnosis not present

## 2023-12-20 DIAGNOSIS — E1169 Type 2 diabetes mellitus with other specified complication: Secondary | ICD-10-CM | POA: Diagnosis not present

## 2023-12-20 DIAGNOSIS — R7309 Other abnormal glucose: Secondary | ICD-10-CM | POA: Diagnosis not present

## 2023-12-20 DIAGNOSIS — Z13220 Encounter for screening for lipoid disorders: Secondary | ICD-10-CM | POA: Diagnosis not present

## 2023-12-20 DIAGNOSIS — Z0000 Encounter for general adult medical examination without abnormal findings: Secondary | ICD-10-CM | POA: Diagnosis not present

## 2024-02-19 ENCOUNTER — Ambulatory Visit (INDEPENDENT_AMBULATORY_CARE_PROVIDER_SITE_OTHER): Admitting: Psychology

## 2024-02-19 ENCOUNTER — Encounter: Payer: Self-pay | Admitting: Psychology

## 2024-02-19 DIAGNOSIS — F411 Generalized anxiety disorder: Secondary | ICD-10-CM

## 2024-02-19 DIAGNOSIS — F330 Major depressive disorder, recurrent, mild: Secondary | ICD-10-CM

## 2024-02-19 NOTE — Progress Notes (Addendum)
 [facility] Counselor Initial Adult Exam  Name: Lauren Rogers Date: 02/19/2024 MRN: [id number] DOB: 26-Oct-1955 PCP: Arloa Elsie SAUNDERS, MD  Time spent: 7:00 - 7:50 am  Guardian/Informant:  Orie Finder - patient    Paperwork requested: No  Met with patient for initial interview.  Patient was at home and session was conducted from therapist's office via video conferencing.  Patient expressed awareness of the limitations related to video sessions and verbally consented to telehealth.    Reason for Visit /Presenting Problem: Patient seeking therapy related to chronic depressed mood, anxiety, and grief.  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  Patient has lost friends over the past 5 years.  She retired last year and declared bankrupty 2 years ago.  Doesn't have a good support system.  She binge eats and her smoking has increased (4-5 cigarettes per day).  Her pet recently died.  Only goes to the grocery and church.  Stopped taking Clonazepam  per PCP orders.  Been taking sertraline  and Wellbutrin  for the past years.    Mental Status Exam: Appearance:   Neat and Well Groomed     Behavior:  Appropriate and Sharing  Motor:  Normal  Speech/Language:   Clear and Coherent and Normal Rate  Affect:  Appropriate and Full Range  Mood:  euthymic  Thought process:  normal  Thought content:    WNL  Sensory/Perceptual disturbances:    WNL  Orientation:  Oriented to person, place time and situation  Attention:  Good  Concentration:  Good  Memory:  WNL  Fund of knowledge:   Good  Insight:    Fair  Judgment:   Good  Impulse Control:  Good   Developmental History: Early delays - No delays.  Just talked a lot. Motor - Good coordination. No physical activity other than walking with grandchildren to the playground.   Speech - Speaks clearly.  Understood by others.   Self Care - adequate self care but doesn't eat healthy and binges when  stressed.   Independent - Good Social - relates well to church members but no current close friendships.    Reported Symptoms:  Trouble falling asleep.  Takes Advil  PM every night to help sleep.  No recent changes in appetite.  Good energy during the day. Periods of prolonged sadness and depressed mood.  Feels depressed every weekend until she gets up to go to church in Sundays.  Care for grandchildren during the week.  Occasional Low SE.  No hopelessness or helplessness.  Sudden anxiety and panic.  Last one was a week ago.  Triggered by family not responding to her.  Worries if friends will talk to her.  Has general worry. Mild social anxiety.  No intrusive thought.  No compulsive behavior.  Some trouble paying attention  TV and reading.  Easily distracted.  Occasional losing and forgetting.  Passed physical exam for Alzheimer's.        Risk Assessment: Danger to Self:  No Self-injurious Behavior: No Danger to Others: No Duty to Warn:no Physical Aggression / Violence:No  Access to Firearms a concern: No  Gang Involvement:No  Patient / guardian was educated about steps to take if suicide or homicide risk level increases between visits: n/a While future psychiatric events cannot be accurately predicted, the patient does not currently require acute inpatient psychiatric care and does not currently meet [place]  involuntary commitment criteria.  Substance Abuse History: Current substance abuse:  Occasional marijuana use.  No alcohol but smokes 4-5 cigarettes daily, mostly in the evening.     Past Psychiatric History:   Previous psychological history is significant for anxiety and depression Outpatient Providers:None previous psychotherapy other than grief counseling during 2017 & 2025.   History of Psych Hospitalization: No  Psychological Testing: None   Abuse History:  Victim of: No., None   Report needed: No. Victim of Neglect:No. Perpetrator of None  Witness / Exposure to Domestic  Violence: No   Protective Services Involvement: No  Witness to [employer] Violence:  No   Family History:  Family History  Problem Relation Age of Onset   Diabetes Father    Cancer Father        prostate, colon  None   Living situation: the patient lives alone.  Great relations with children.  Strained relations with sisters since parents died.  Sexual Orientation: Straight  Relationship Status: divorced Married 1988-99.  Husband cheated house foreclosed car repossessed.  Mother told her to tough it out.   Name of spouse / other:None - last time 10 years ago.   If a parent, number of children / ages:Has two children 49 & 32.  Eight grandchildren.  Two great grandchildren/  sees grandchildren but does not see great grandchildren.    Support Systems: [employer] but doesn't tell them everything.  Wanted to confide in sister but sister told her to talk to somebody.   Financial Stress:  Yes   Income/Employment/Disability: Social Security Retirement - family helps out some as well.  Had to move from a house to an apartment after filing for bankruptcy 2 years ago.    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   Military Service: No   Educational History: Education: some college  Religion/Sprituality/World View: Methodist - nondenominational  Any cultural differences that may affect / interfere with treatment:  black/AA  Recreation/Hobbies: Used to go to concerts.  Still goes to comedy club, but doesn't know what to do on most days.  Doesn't know what makes her happy   Stressors: Financial difficulties   Other: Loneliness  nobody to talk to  Strengths: Talking, hospitality, helping people.  Barriers:  Just doesn't know what to do.     Legal History: Pending legal issue / charges: The patient has no significant history of legal issues. Other than owing taxes to IRS.      Medical History/Surgical History: reviewed Past Medical  History:  Diagnosis Date   Anxiety    Depression     Past Surgical History:  Procedure Laterality Date   ABDOMINAL HYSTERECTOMY  1998   LAPAROSCOPIC APPENDECTOMY N/A 02/20/2015   Procedure: APPENDECTOMY LAPAROSCOPIC;  Surgeon: Jina Nephew, MD;  Location: [facility] OR;  Service: General;  Laterality: N/A;    Medications: Current Outpatient Medications  Medication Sig Dispense Refill   buPROPion  (WELLBUTRIN  XL) 300 MG 24 hr tablet Take 300 mg by mouth daily.     BuPROPion  HCl (WELLBUTRIN  PO) Take 1 tablet by mouth daily.     docusate sodium  (COLACE) 100 MG capsule Take 1 capsule (100 mg total) by mouth 2 (two) times daily as needed for mild constipation. (Patient not taking: Reported on 09/04/2016) 10 capsule 0   LISINOPRIL PO Take 1 tablet by mouth daily.     meclizine  (ANTIVERT ) 25 MG tablet Take 1 tablet (25 mg total) by mouth 3 (three) times daily as needed for dizziness. 30 tablet 0  omeprazole  (PRILOSEC) 40 MG capsule Take 1 capsule (40 mg total) by mouth daily. (Patient not taking: Reported on 02/20/2015)     No current facility-administered medications for this visit.    No Known Allergies or digestion problems, concussions, seizures, or head injuries.   Diagnoses:  Mild episode of recurrent major depressive disorder (HCC)  Generalized anxiety disorder  Plan of Care: Patient presents with a history of recurrent depressed mood and excessive worry.  These have occurred throughout her adult life but have increased in tensity over the past 5 years related to grief, strained family relations, financial difficulties, and lack of purpose/activity since retirement.  Regular therapy sessions recommended to help patient relieve her depressed mood and better manage her worry.     Goal: Elevate mood and relieve depressive symptoms  Objective: Patient to engage in physical activity during 90% of days.  Target date: 08/22/2024 Progress: 0  Objective: Patient to write down positive thoughts  in journal daily.  Target date: 02/23/2024 Progress: 0  Goal: Better manage anxiety and excessive worry.  Objective: Patient to engage in physical calming activity activity during 80% of days.  Target date: 08/22/2024 Progress: 0  Objective: Patient to to notice thoughts that are either exaggerated, unlikely to happen or out of her control during 80% of the instances.  Target date: 02/22/2025 Progress: 0  Abdalrahman Clementson, PhD

## 2024-02-19 NOTE — Progress Notes (Signed)
   Bryson Dames, PhD

## 2024-03-10 ENCOUNTER — Encounter: Payer: Self-pay | Admitting: Psychology

## 2024-03-10 ENCOUNTER — Ambulatory Visit (INDEPENDENT_AMBULATORY_CARE_PROVIDER_SITE_OTHER): Admitting: Psychology

## 2024-03-10 DIAGNOSIS — F330 Major depressive disorder, recurrent, mild: Secondary | ICD-10-CM

## 2024-03-10 DIAGNOSIS — F411 Generalized anxiety disorder: Secondary | ICD-10-CM

## 2024-03-10 NOTE — Progress Notes (Addendum)
  [facility] Counselor/Therapist Progress Note  Patient ID: Lauren Rogers, MRN: [id number],    Date: 03/10/2024  Time Spent: 7:00 - 7:40   Treatment Type: Individual Therapy  Met with patient for therapy session.  Patient was at home and session was conducted from therapist's office via video conferencing.  Patient expressed awareness of the limitations related to video sessions and verbally consented to telehealth.    Reported Symptoms: Patient presents with a history of recurrent depressed mood and excessive worry. These have occurred throughout her adult life but have increased in tensity over the past 5 years related to grief, strained family relations, financial difficulties, and lack of purpose/activity since retirement. Regular therapy sessions recommended to help patient relieve her depressed mood and better manage her worry.   Mental Status Exam: Appearance:  Neat and Well Groomed     Behavior: Appropriate and Sharing  Motor: Normal  Speech/Language:  Clear and Coherent and Normal Rate  Affect: Congruent  Mood: anxious and dysthymic  Thought process: normal  Thought content:   WNL  Sensory/Perceptual disturbances:   WNL  Orientation: oriented to person, place, time/date, and situation  Attention: Good  Concentration: Good  Memory: WNL  Fund of knowledge:  Good  Insight:   Fair  Judgment:  Good  Impulse Control: Good   Risk Assessment: Danger to Self:  No Self-injurious Behavior: No Danger to Others: No Duty to Warn:no Physical Aggression / Violence:No  Access to Firearms a concern: No  Gang Involvement:No   Subjective: Patient reported still feeling depressed as she becomes emotionally exhausting after caring for her grandchildren 5 days per week. She typically stays in her bed during evenings and weekends, but mentioned that she occasionally will go to the park, meet her new neighbors, or talk to her family.  She is looking forward to attending her  grandson's birth day this weekend.     Interventions: Cognitive Behavioral Therapy and Mindfulness Meditation.  Emphasis was on using deep breathing to calm then noticing thoughts that are either exaggerated, unlikely to happen or out of her control.    Assessment: Patient expressed being receptive to intervention but stated that she is going to have to implement intervention recommendations slowly and gradually.   Diagnosis:Mild episode of recurrent major depressive disorder (HCC)  Generalized anxiety disorder  Plan: Patient presents with a history of recurrent depressed mood and excessive worry.  These have occurred throughout her adult life but have increased in tensity over the past 5 years related to grief, strained family relations, financial difficulties, and lack of purpose/activity since retirement.  Regular therapy sessions recommended to help patient relieve her depressed mood and better manage her worry.      Goal: Elevate mood and relieve depressive symptoms   Objective: Patient to engage in physical activity during 90% of days.   Target date: 08/22/2024 Progress: 0   Objective: Patient to write down positive thoughts in journal daily.   Target date: 02/22/2025 Progress: 0   Goal: Better manage anxiety and excessive worry.   Objective: Patient to engage in physical calming activity activity during 80% of days.   Target date: 08/22/2024 Progress: 10   Objective: Patient to to notice thoughts that are either exaggerated, unlikely to happen or out of her control during 80% of the instances.   Target date: 02/22/2025 Progress: 5  Alesandra Smart, PhD

## 2024-03-10 NOTE — Progress Notes (Signed)
   Lauren Dames, PhD

## 2024-03-25 ENCOUNTER — Ambulatory Visit: Admitting: Psychology

## 2024-03-25 ENCOUNTER — Encounter: Payer: Self-pay | Admitting: Psychology

## 2024-03-25 DIAGNOSIS — F411 Generalized anxiety disorder: Secondary | ICD-10-CM

## 2024-03-25 DIAGNOSIS — F330 Major depressive disorder, recurrent, mild: Secondary | ICD-10-CM

## 2024-03-25 NOTE — Progress Notes (Signed)
 [facility] Counselor/Therapist Progress Note  Patient ID: Lauren Rogers, MRN: [id number],    Date: 03/25/2024  Time Spent: 7:00 - 7:45 pm  Treatment Type: Individual Therapy  Met with patient for therapy session.  Patient was at home and session was conducted from therapist's office via video conferencing.  Patient expressed awareness of the limitations related to video sessions and verbally consented to telehealth.    Reported Symptoms: Patient presents with a history of recurrent depressed mood and excessive worry. These have occurred throughout her adult life but have increased in tensity over the past 5 years related to grief, strained family relations, financial difficulties, and lack of purpose/activity since retirement. Regular therapy sessions recommended to help patient relieve her depressed mood and better manage her worry.   Mental Status Exam: Appearance:  Neat and Well Groomed     Behavior: Appropriate and Sharing  Motor: Normal  Speech/Language:  Clear and Coherent and Normal Rate  Affect: Congruent  Mood: Mildly depressed but not anxious  Thought process: normal  Thought content:   WNL  Sensory/Perceptual disturbances:   WNL  Orientation: oriented to person, place, time/date, and situation  Attention: Good  Concentration: Good  Memory: WNL  Fund of knowledge:  Good  Insight:   Good  Judgment:  Good  Impulse Control: Good   Risk Assessment: Danger to Self:  No Self-injurious Behavior: No Danger to Others: No Duty to Warn:no Physical Aggression / Violence:No  Access to Firearms a concern: No  Gang Involvement:No   Subjective: Patient reported having daily crying spells but making more of an effort to engage with others and leave the apartment.  She has been attending church more often and has been going to the park to watch wildlife.  She has the most difficulty at night when she is alone, although the crying spells can occur during the day as  well.    Interventions: Cognitive Behavioral Therapy and Mindfulness Meditation.  Emphasis was on accepting the events that she cannot control and putting her attention toward planning activities that can help her move forward with her life.    Assessment: Patient appears committed to taking the actions necessary to improve her mood, although she may need some medication to help with the crying spells.  She has spoke with her PCP about this.    Diagnosis:Mild episode of recurrent major depressive disorder  Generalized anxiety disorder  Plan: Patient presents with a history of recurrent depressed mood and excessive worry.  These have occurred throughout her adult life but have increased in tensity over the past 5 years related to grief, strained family relations, financial difficulties, and lack of purpose/activity since retirement.  Regular therapy sessions recommended to help patient relieve her depressed mood and better manage her worry.      Goal: Elevate mood and relieve depressive symptoms   Objective: Patient to engage in physical activity during 90% of days.   Target date: 08/22/2024 Progress: 10   Objective: Patient to write down positive thoughts in journal daily.   Target date: 02/22/2025 Progress: 5   Goal: Better manage anxiety and excessive worry.   Objective: Patient to engage in physical calming activity activity during 80% of days.   Target date: 08/22/2024 Progress: 15   Objective: Patient to to notice thoughts that are either exaggerated, unlikely to happen or out of her control during 80% of the instances.   Target date: 02/22/2025 Progress: 10  Ules Marsala, PhD  Doryce Mcgregory, PhD

## 2024-06-23 ENCOUNTER — Encounter: Payer: Self-pay | Admitting: Psychology

## 2024-06-23 ENCOUNTER — Ambulatory Visit: Admitting: Psychology

## 2024-06-23 DIAGNOSIS — F411 Generalized anxiety disorder: Secondary | ICD-10-CM | POA: Diagnosis not present

## 2024-06-23 DIAGNOSIS — F330 Major depressive disorder, recurrent, mild: Secondary | ICD-10-CM | POA: Diagnosis not present

## 2024-06-23 NOTE — Progress Notes (Signed)
 "  [facility] Counselor/Therapist Progress Note  Patient ID: CONSTANZA MINCY, MRN: [id number],    Date: 06/23/2024  Time Spent: 7:00 - 7:45 pm  Treatment Type: Individual Therapy  Met with patient for therapy session.  Patient was at home and session was conducted from therapist's office via video conferencing.  Patient expressed awareness of the limitations related to video sessions and verbally consented to telehealth.    Reported Symptoms: Patient presents with a history of recurrent depressed mood and excessive worry. These have occurred throughout her adult life but have increased in tensity over the past 5 years related to grief, strained family relations, financial difficulties, and lack of purpose/activity since retirement. Regular therapy sessions recommended to help patient relieve her depressed mood and better manage her worry.   Mental Status Exam: Appearance:  Neat and Well Groomed     Behavior: Appropriate and Sharing  Motor: Normal  Speech/Language:  Clear and Coherent and Normal Rate  Affect: Congruent  Mood: Mildly depressed but not anxious  Thought process: normal  Thought content:   WNL  Sensory/Perceptual disturbances:   WNL  Orientation: oriented to person, place, time/date, and situation  Attention: Good  Concentration: Good  Memory: WNL  Fund of knowledge:  Good  Insight:   Good  Judgment:  Good  Impulse Control: Good   Risk Assessment: Danger to Self:  No Self-injurious Behavior: No Danger to Others: No Duty to Warn:no Physical Aggression / Violence:No  Access to Firearms a concern: No  Gang Involvement:No   Subjective: Patient reported having continued daily crying spells mostly occurring when first waking in the morning or alone at night.  She has continued thoughts about recent friendships that have ended along with being more isolated due to [place] due to finances.   She recently spent Thanksgiving with her sister who was critical toward her for events that happened during childhood that were not part of patient's control. Patient felt most upset about not picking up on the signals that her friendship was deteriorating.     Interventions: Cognitive Behavioral Therapy and Mindfulness Meditation.  Emphasis was on accepting her thoughts and focusing on implementing her plans for increasing meaningful activity.    Assessment: Patient outline a helpful plan for improving her mood.  She now needs to to take the initiative of implementing the plan, even when she does not feel ike doing so.      Diagnosis:Mild episode of recurrent major depressive disorder  Generalized anxiety disorder  Plan: Patient was referred for psychiatry to discuss medication options.  This was discussed with patient and she gave verbal consent.  Patient presents with a history of recurrent depressed mood and excessive worry.  These have occurred throughout her adult life but have increased in tensity over the past 5 years related to grief, strained family relations, financial difficulties, and lack of purpose/activity since retirement.  Regular therapy sessions recommended to help patient relieve her depressed mood and better manage her worry.      Goal: Elevate mood and relieve depressive symptoms   Objective: Patient to engage in physical activity during 90% of days.   Target date: 08/22/2024 Progress: 10   Objective: Patient to write down positive thoughts in journal daily.   Target date: 02/22/2025 Progress: 15   Goal: Better manage anxiety and excessive worry.   Objective: Patient to engage in physical calming activity activity during 80% of days.  Target date: 08/22/2024 Progress: 35   Objective: Patient to to notice thoughts that are either exaggerated, unlikely to happen or out of her control during 80% of the instances.   Target date: 02/22/2025 Progress: 20  Iverson Sees, PhD                                  "
